# Patient Record
Sex: Female | Born: 1990 | Race: Black or African American | Hispanic: No | Marital: Single | Smoking: Former smoker
Health system: Southern US, Community
[De-identification: ages and names within clinical notes are randomized; demographics above are authoritative.]

## PROBLEM LIST (undated history)

## (undated) ENCOUNTER — Inpatient Hospital Stay (HOSPITAL_COMMUNITY): Payer: Self-pay

## (undated) DIAGNOSIS — O139 Gestational [pregnancy-induced] hypertension without significant proteinuria, unspecified trimester: Secondary | ICD-10-CM

## (undated) HISTORY — PX: NO PAST SURGERIES: SHX2092

---
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
## 2010-01-16 ENCOUNTER — Inpatient Hospital Stay (HOSPITAL_COMMUNITY): Admission: AD | Admit: 2010-01-16 | Discharge: 2010-01-16 | Payer: Self-pay | Admitting: Obstetrics & Gynecology

## 2010-01-16 ENCOUNTER — Ambulatory Visit: Payer: Self-pay | Admitting: Advanced Practice Midwife

## 2010-05-28 ENCOUNTER — Inpatient Hospital Stay (HOSPITAL_COMMUNITY)
Admission: AD | Admit: 2010-05-28 | Discharge: 2010-05-28 | Payer: Self-pay | Source: Home / Self Care | Admitting: Family Medicine

## 2010-05-28 ENCOUNTER — Encounter: Payer: Self-pay | Admitting: Family Medicine

## 2010-05-28 ENCOUNTER — Ambulatory Visit: Payer: Self-pay | Admitting: Nurse Practitioner

## 2010-07-17 ENCOUNTER — Emergency Department (HOSPITAL_COMMUNITY): Admission: EM | Admit: 2010-07-17 | Discharge: 2010-07-17 | Payer: Self-pay | Admitting: Emergency Medicine

## 2010-08-22 ENCOUNTER — Inpatient Hospital Stay (HOSPITAL_COMMUNITY)
Admission: AD | Admit: 2010-08-22 | Discharge: 2010-08-22 | Payer: Self-pay | Source: Home / Self Care | Admitting: Obstetrics & Gynecology

## 2010-09-03 ENCOUNTER — Ambulatory Visit: Payer: Self-pay | Admitting: Obstetrics and Gynecology

## 2010-09-03 ENCOUNTER — Inpatient Hospital Stay (HOSPITAL_COMMUNITY): Admission: AD | Admit: 2010-09-03 | Discharge: 2010-09-07 | Payer: Self-pay | Admitting: Obstetrics & Gynecology

## 2010-10-10 ENCOUNTER — Ambulatory Visit: Payer: Self-pay | Admitting: Obstetrics & Gynecology

## 2010-12-24 LAB — COMPREHENSIVE METABOLIC PANEL
ALT: 14 U/L (ref 0–35)
BUN: 3 mg/dL — ABNORMAL LOW (ref 6–23)
CO2: 22 mEq/L (ref 19–32)
Chloride: 109 mEq/L (ref 96–112)
Creatinine, Ser: 0.67 mg/dL (ref 0.4–1.2)
GFR calc Af Amer: >60 mL/min (ref >60)
Glucose, Bld: 103 mg/dL — ABNORMAL HIGH (ref 70–99)
Potassium: 3.6 mEq/L (ref 3.5–5.1)
Sodium: 138 mEq/L (ref 135–145)

## 2010-12-24 LAB — CBC
HCT: 28.3 % — ABNORMAL LOW (ref 36.0–46.0)
HCT: 29.3 % — ABNORMAL LOW (ref 36.0–46.0)
Hemoglobin: 10.2 g/dL — ABNORMAL LOW (ref 12.0–15.0)
Hemoglobin: 9.8 g/dL — ABNORMAL LOW (ref 12.0–15.0)
MCH: 33.9 pg (ref 26.0–34.0)
MCHC: 34.1 g/dL (ref 30.0–36.0)
MCHC: 34.4 g/dL (ref 30.0–36.0)
MCV: 99.4 fL (ref 78.0–100.0)
Platelets: 173 10*3/uL (ref 150–400)
Platelets: 194 10*3/uL (ref 150–400)
Platelets: 195 10*3/uL (ref 150–400)
RBC: 2.86 MIL/uL — ABNORMAL LOW (ref 3.87–5.11)
RBC: 3 MIL/uL — ABNORMAL LOW (ref 3.87–5.11)
RDW: 14.5 % (ref 11.5–15.5)
RDW: 14.8 % (ref 11.5–15.5)
RDW: 15.1 % (ref 11.5–15.5)
WBC: 10.9 10*3/uL — ABNORMAL HIGH (ref 4.0–10.5)
WBC: 17.2 10*3/uL — ABNORMAL HIGH (ref 4.0–10.5)

## 2010-12-24 LAB — DIFFERENTIAL
Basophils Absolute: 0 10*3/uL (ref 0.0–0.1)
Basophils Absolute: 0 10*3/uL (ref 0.0–0.1)
Basophils Relative: 0 % (ref 0–1)
Eosinophils Absolute: 0 10*3/uL (ref 0.0–0.7)
Eosinophils Relative: 0 % (ref 0–5)
Eosinophils Relative: 1 % (ref 0–5)
Lymphocytes Relative: 26 % (ref 12–46)
Lymphs Abs: 1.4 10*3/uL (ref 0.7–4.0)
Monocytes Absolute: 1.1 10*3/uL — ABNORMAL HIGH (ref 0.1–1.0)
Monocytes Relative: 6 % (ref 3–12)
Neutro Abs: 3.4 10*3/uL (ref 1.7–7.7)
Neutrophils Relative %: 89 % — ABNORMAL HIGH (ref 43–77)

## 2010-12-24 LAB — RAPID URINE DRUG SCREEN, HOSP PERFORMED
Barbiturates: NOT DETECTED
Benzodiazepines: NOT DETECTED
Cocaine: NOT DETECTED
Tetrahydrocannabinol: NOT DETECTED

## 2010-12-24 LAB — MRSA PCR SCREENING: MRSA by PCR: NEGATIVE

## 2010-12-24 LAB — STREP B DNA PROBE

## 2010-12-24 LAB — RPR: RPR Ser Ql: NONREACTIVE

## 2010-12-24 LAB — URINE CULTURE
Colony Count: 6000
Culture  Setup Time: 201111252307
Special Requests: NEGATIVE

## 2010-12-24 LAB — URINALYSIS, ROUTINE W REFLEX MICROSCOPIC
Ketones, ur: NEGATIVE mg/dL
Nitrite: NEGATIVE
Protein, ur: NEGATIVE mg/dL
Specific Gravity, Urine: <1.005 — ABNORMAL LOW (ref 1.005–1.030)
Urobilinogen, UA: 1 mg/dL (ref 0.0–1.0)

## 2010-12-24 LAB — URINALYSIS, DIPSTICK ONLY
Glucose, UA: NEGATIVE mg/dL
pH: 7 (ref 5.0–8.0)

## 2010-12-24 LAB — LACTATE DEHYDROGENASE: LDH: 130 U/L (ref 94–250)

## 2010-12-24 LAB — SICKLE CELL SCREEN: Sickle Cell Screen: NEGATIVE

## 2010-12-24 LAB — URINE MICROSCOPIC-ADD ON

## 2010-12-24 LAB — PROTEIN / CREATININE RATIO, URINE: Creatinine, Urine: 30 mg/dL

## 2010-12-24 LAB — HEPATITIS B SURFACE ANTIGEN: Hepatitis B Surface Ag: NEGATIVE

## 2010-12-24 LAB — RUBELLA SCREEN: Rubella: 165.4 IU/mL — ABNORMAL HIGH

## 2010-12-27 LAB — URINALYSIS, ROUTINE W REFLEX MICROSCOPIC
Bilirubin Urine: NEGATIVE
Bilirubin Urine: NEGATIVE
Glucose, UA: NEGATIVE mg/dL
Ketones, ur: NEGATIVE mg/dL
Ketones, ur: NEGATIVE mg/dL
Nitrite: NEGATIVE
Protein, ur: NEGATIVE mg/dL
pH: 7.5 (ref 5.0–8.0)

## 2010-12-27 LAB — WET PREP, GENITAL: Yeast Wet Prep HPF POC: NONE SEEN

## 2010-12-27 LAB — URINE MICROSCOPIC-ADD ON

## 2010-12-27 LAB — GC/CHLAMYDIA PROBE AMP, URINE: GC Probe Amp, Urine: NEGATIVE

## 2011-01-01 LAB — URINALYSIS, ROUTINE W REFLEX MICROSCOPIC
Glucose, UA: NEGATIVE mg/dL
Nitrite: NEGATIVE
Protein, ur: NEGATIVE mg/dL
Urobilinogen, UA: 0.2 mg/dL (ref 0.0–1.0)

## 2011-01-01 LAB — WET PREP, GENITAL

## 2011-01-01 LAB — GC/CHLAMYDIA PROBE AMP, GENITAL
Chlamydia, DNA Probe: POSITIVE — AB
GC Probe Amp, Genital: NEGATIVE

## 2011-01-01 LAB — URINE MICROSCOPIC-ADD ON

## 2011-10-21 ENCOUNTER — Inpatient Hospital Stay (HOSPITAL_COMMUNITY)
Admission: AD | Admit: 2011-10-21 | Discharge: 2011-10-21 | Disposition: A | Discharge status: Home / Self Care | Payer: Medicaid Other | Source: Ambulatory Visit | Attending: Obstetrics & Gynecology | Admitting: Obstetrics & Gynecology

## 2011-10-21 ENCOUNTER — Encounter (HOSPITAL_COMMUNITY): Payer: Self-pay | Admitting: *Deleted

## 2011-10-21 DIAGNOSIS — Z331 Pregnant state, incidental: Secondary | ICD-10-CM

## 2011-10-21 DIAGNOSIS — M549 Dorsalgia, unspecified: Secondary | ICD-10-CM | POA: Insufficient documentation

## 2011-10-21 DIAGNOSIS — B3731 Acute candidiasis of vulva and vagina: Secondary | ICD-10-CM | POA: Insufficient documentation

## 2011-10-21 DIAGNOSIS — B373 Candidiasis of vulva and vagina: Secondary | ICD-10-CM

## 2011-10-21 DIAGNOSIS — O2390 Unspecified genitourinary tract infection in pregnancy, unspecified trimester: Secondary | ICD-10-CM | POA: Insufficient documentation

## 2011-10-21 HISTORY — DX: Gestational (pregnancy-induced) hypertension without significant proteinuria, unspecified trimester: O13.9

## 2011-10-21 LAB — URINALYSIS, ROUTINE W REFLEX MICROSCOPIC
Protein, ur: NEGATIVE mg/dL
Urobilinogen, UA: 0.2 mg/dL (ref 0.0–1.0)

## 2011-10-21 LAB — URINE CULTURE
Culture  Setup Time: 201301082133
Special Requests: NORMAL

## 2011-10-21 LAB — URINE MICROSCOPIC-ADD ON

## 2011-10-21 MED ORDER — FLUCONAZOLE 150 MG PO TABS: 150.0000 mg | ORAL_TABLET | Freq: Once | ORAL | Status: AC

## 2011-10-21 NOTE — Progress Notes (Signed)
Pt denies any urinary symptoms.

## 2011-10-21 NOTE — ED Provider Notes (Signed)
History   Chief Complaint:  Back Pain  Lynn Lynch is  21 y.o. G2P1001 Patient's last menstrual period was 09/29/2011.Marland Kitchen  Her pregnancy status is positive.  She is [redacted]w[redacted]d by LMP.  She presents complaining of dull spinal back pain, no urinary or other systemic symptoms.  No bleeding or abdominal pain.  Past Medical History  Diagnosis Date  . Gestational hypertension     Past Surgical History  Procedure Date  . No past surgeries     History reviewed. No pertinent family history.  History  Substance Use Topics  . Smoking status: Never Smoker   . Smokeless tobacco: Not on file  . Alcohol Use: No    Allergies: Allergies no known allergies  No prescriptions prior to admission    Physical Exam   Blood pressure 136/85, pulse 93, temperature 98.7 F (37.1 C), temperature source Oral, resp. rate 20, height 5\' 2"  (1.575 m), weight 92.352 kg (203 lb 9.6 oz), last menstrual period 09/29/2011, SpO2 99.00%. General: NAD Back: No CVAT  Labs: Recent Results (from the past 24 hour(s))  URINALYSIS, ROUTINE W REFLEX MICROSCOPIC   Collection Time   10/21/11  1:25 PM      Component Value Range   Color, Urine YELLOW  YELLOW    APPearance HAZY (*) CLEAR    Specific Gravity, Urine 1.025  1.005 - 1.030    pH 6.0  5.0 - 8.0    Glucose, UA NEGATIVE  NEGATIVE (mg/dL)   Hgb urine dipstick NEGATIVE  NEGATIVE    Bilirubin Urine NEGATIVE  NEGATIVE    Ketones, ur NEGATIVE  NEGATIVE (mg/dL)   Protein, ur NEGATIVE  NEGATIVE (mg/dL)   Urobilinogen, UA 0.2  0.0 - 1.0 (mg/dL)   Nitrite NEGATIVE  NEGATIVE    Leukocytes, UA MODERATE (*) NEGATIVE   URINE MICROSCOPIC-ADD ON   Collection Time   10/21/11  1:25 PM      Component Value Range   Squamous Epithelial / LPF FEW (*) RARE    WBC, UA 3-6  <3 (WBC/hpf)   RBC / HPF 3-6  <3 (RBC/hpf)   Bacteria, UA MANY (*) RARE    Urine-Other RARE YEAST    POCT PREGNANCY, URINE   Collection Time   10/21/11  2:32 PM      Component Value Range   Preg Test, Ur  POSITIVE     Assessment: Yeast infection Early pregnancy, no bleeding or abdominal pain.  Plan: Diflucan Rx given to patient She was advised to start prenatal care  Discharge Medications: Current Discharge Medication List    START taking these medications   Details  fluconazole (DIFLUCAN) 150 MG tablet Take 1 tablet (150 mg total) by mouth once. Qty: 1 tablet, Refills: 2         Wanya Bangura A 10/21/2011, 3:35 PM

## 2011-10-21 NOTE — Progress Notes (Signed)
Pt in c/o dull, intermittent lower back pain x2-3 days.  LMP 09/29/11.  Denies any bleeding or discharge.

## 2011-10-21 NOTE — Progress Notes (Signed)
Patient states she has had mid to low back pain for two days. Last period was only 3 days. No bleeding or discharge.

## 2011-11-04 ENCOUNTER — Inpatient Hospital Stay (HOSPITAL_COMMUNITY): Payer: Medicaid Other

## 2011-11-04 ENCOUNTER — Encounter (HOSPITAL_COMMUNITY): Payer: Self-pay | Admitting: *Deleted

## 2011-11-04 ENCOUNTER — Inpatient Hospital Stay (HOSPITAL_COMMUNITY)
Admission: AD | Admit: 2011-11-04 | Discharge: 2011-11-04 | Disposition: A | Discharge status: Home / Self Care | Payer: Medicaid Other | Source: Ambulatory Visit | Attending: Obstetrics & Gynecology | Admitting: Obstetrics & Gynecology

## 2011-11-04 DIAGNOSIS — O200 Threatened abortion: Secondary | ICD-10-CM | POA: Insufficient documentation

## 2011-11-04 LAB — CBC
HCT: 33.3 % — ABNORMAL LOW (ref 36.0–46.0)
Hemoglobin: 11.1 g/dL — ABNORMAL LOW (ref 12.0–15.0)
MCV: 91.5 fL (ref 78.0–100.0)
RBC: 3.64 MIL/uL — ABNORMAL LOW (ref 3.87–5.11)
WBC: 4.4 10*3/uL (ref 4.0–10.5)

## 2011-11-04 LAB — WET PREP, GENITAL
Clue Cells Wet Prep HPF POC: NONE SEEN
Trich, Wet Prep: NONE SEEN
Yeast Wet Prep HPF POC: NONE SEEN

## 2011-11-04 NOTE — Progress Notes (Signed)
Pt states," On Sunday I had some pink bleeding when I wiped, and it has gradually gotten heavier.I am changing my pad every two hours and I'm having cramping in my low abdomen and low back."

## 2011-11-04 NOTE — ED Provider Notes (Signed)
History    G2P1 at  [redacted] weeks gestation presents to MAU with vaginal bleeding starting with spotting 2 days ago and progressing to bright red bleeding, soaking 1 pad in 2 hours today.  She is also feeling back pain and abdominal cramping starting today.  Chief Complaint  Patient presents with  . Vaginal Bleeding  . Abdominal Pain  . Back Pain   HPI  OB History    Grav Para Term Preterm Abortions TAB SAB Ect Mult Living   2 1 1  0 0 0 0 0 0 1      Past Medical History  Diagnosis Date  . Gestational hypertension     Past Surgical History  Procedure Date  . No past surgeries     History reviewed. No pertinent family history.  History  Substance Use Topics  . Smoking status: Never Smoker   . Smokeless tobacco: Never Used  . Alcohol Use: No    Allergies: No Known Allergies  Prescriptions prior to admission  Medication Sig Dispense Refill  . acetaminophen (TYLENOL) 325 MG tablet Take 325 mg by mouth every 6 (six) hours as needed. Takes for  pain      . Prenatal Vit-Fe Fumarate-FA (PRENATAL MULTIVITAMIN) TABS Take 1 tablet by mouth daily.        Review of Systems  Constitutional: Negative.   HENT: Negative.   Eyes: Negative.   Respiratory: Negative.   Cardiovascular: Negative.   Gastrointestinal: Negative.   Genitourinary: Negative.   Musculoskeletal: Negative.   Skin: Negative.   Neurological: Negative.   Endo/Heme/Allergies: Negative.   Psychiatric/Behavioral: Negative.    Physical Exam   Blood pressure 125/82, pulse 66, temperature 98 F (36.7 C), temperature source Oral, resp. rate 16, height 5' 2.5" (1.588 m), weight 93.214 kg (205 lb 8 oz), last menstrual period 09/29/2011.  Physical Exam  Constitutional: She is oriented to person, place, and time. She appears well-developed.  Neck: Normal range of motion.  Respiratory: Effort normal.  GI: Soft.  Genitourinary: Cervix exhibits no motion tenderness and no friability.       Cervix pink, bright red  bleeding noted from cervical os, which appears closed.      Neurological: She is alert and oriented to person, place, and time.  Skin: Skin is warm and dry.  Psychiatric: She has a normal mood and affect. Her behavior is normal. Judgment and thought content normal.   Recent Results (from the past 336 hour(s))  WET PREP, GENITAL   Collection Time   11/04/11  6:27 PM      Component Value Range   Yeast, Wet Prep NONE SEEN  NONE SEEN    Trich, Wet Prep NONE SEEN  NONE SEEN    Clue Cells, Wet Prep NONE SEEN  NONE SEEN    WBC, Wet Prep HPF POC FEW (*) NONE SEEN   GC/CHLAMYDIA PROBE AMP, GENITAL   Collection Time   11/04/11  6:27 PM      Component Value Range   GC Probe Amp, Genital NEGATIVE  NEGATIVE    Chlamydia, DNA Probe NEGATIVE  NEGATIVE   CBC   Collection Time   11/04/11  6:40 PM      Component Value Range   WBC 4.4  4.0 - 10.5 (K/uL)   RBC 3.64 (*) 3.87 - 5.11 (MIL/uL)   Hemoglobin 11.1 (*) 12.0 - 15.0 (g/dL)   HCT 46.9 (*) 62.9 - 46.0 (%)   MCV 91.5  78.0 - 100.0 (fL)   MCH  30.5  26.0 - 34.0 (pg)   MCHC 33.3  30.0 - 36.0 (g/dL)   RDW 16.1  09.6 - 04.5 (%)   Platelets 235  150 - 400 (K/uL)  ABO/RH   Collection Time   11/04/11  6:40 PM      Component Value Range   ABO/RH(D) A POS    HCG, QUANTITATIVE, PREGNANCY   Collection Time   11/04/11  6:40 PM      Component Value Range   hCG, Beta Chain, Quant, S 14 (*) <5 (mIU/mL)   U/S indicates IUP but no yolk sac or fetal pole visualized with recommendation for sequential [facility] labs and f/u u/s if rising appropriately.  MAU Course  Procedures U/A Pelvic exam with wet prep and GC/Chlamydia U/S   Assessment and Plan  A: Threatened miscarriage  P: D/C home with bleeding precautions Return to MAU for Quantitative hCG in 48 hours  LEFTWICH-KIRBY, Shatara Stanek 11/04/2011, 8:40 PM

## 2011-11-05 LAB — GC/CHLAMYDIA PROBE AMP, GENITAL: GC Probe Amp, Genital: NEGATIVE

## 2011-12-15 ENCOUNTER — Emergency Department (HOSPITAL_COMMUNITY)
Admission: EM | Admit: 2011-12-15 | Discharge: 2011-12-15 | Disposition: A | Discharge status: Home Health | Payer: Medicaid Other | Attending: Emergency Medicine | Admitting: Emergency Medicine

## 2011-12-15 ENCOUNTER — Encounter (HOSPITAL_COMMUNITY): Payer: Self-pay | Admitting: Emergency Medicine

## 2011-12-15 DIAGNOSIS — F17200 Nicotine dependence, unspecified, uncomplicated: Secondary | ICD-10-CM | POA: Insufficient documentation

## 2011-12-15 DIAGNOSIS — K047 Periapical abscess without sinus: Secondary | ICD-10-CM

## 2011-12-15 DIAGNOSIS — K089 Disorder of teeth and supporting structures, unspecified: Secondary | ICD-10-CM | POA: Insufficient documentation

## 2011-12-15 DIAGNOSIS — R220 Localized swelling, mass and lump, head: Secondary | ICD-10-CM | POA: Insufficient documentation

## 2011-12-15 MED ORDER — PENICILLIN V POTASSIUM 250 MG PO TABS
500.0000 mg | ORAL_TABLET | Freq: Once | ORAL | Status: AC
  Administered 2011-12-15: 500 mg via ORAL
  Filled 2011-12-15: qty 2

## 2011-12-15 MED ORDER — PENICILLIN V POTASSIUM 500 MG PO TABS: 500.0000 mg | ORAL_TABLET | Freq: Three times a day (TID) | ORAL | Status: AC

## 2011-12-15 MED ORDER — HYDROCODONE-ACETAMINOPHEN 5-325 MG PO TABS: 1.0000 | ORAL_TABLET | Freq: Once | ORAL | Status: AC

## 2011-12-15 MED ORDER — HYDROCODONE-ACETAMINOPHEN 5-325 MG PO TABS
1.0000 | ORAL_TABLET | Freq: Once | ORAL | Status: AC
  Administered 2011-12-15: 1 via ORAL
  Filled 2011-12-15: qty 1

## 2011-12-15 NOTE — ED Notes (Signed)
Onset middle of the night awoke with swelling left side of face radiating to left neck. Airway intact states pain 10/10 achy.  Bilateral equal chest rise and fall. Resting comfortably on stretcher. ax4

## 2011-12-15 NOTE — Discharge Instructions (Signed)
FOLLOW UP WITH YOUR DENTIST FOR FURTHER MANAGEMENT OF DENTAL ABSCESS. TAKE MEDICATIONS AS PRESCRIBED AND RETURN HERE AS NEEDED.  Dental Abscess A dental abscess usually starts from an infected tooth. Antibiotic medicine and pain pills can be helpful, but dental infections require the attention of a dentist. Rinse around the infected area often with salt water (a pinch of salt in 8 oz of warm water). Do not apply heat to the outside of your face. See your dentist or oral surgeon as soon as possible.  SEEK IMMEDIATE MEDICAL CARE IF:  You have increasing, severe pain that is not relieved by medicine.   You or your child has an oral temperature above 102 F (38.9 C), not controlled by medicine.   Your baby is older than 3 months with a rectal temperature of 102 F (38.9 C) or higher.   Your baby is 58 months old or younger with a rectal temperature of 100.4 F (38 C) or higher.   You develop chills, severe headache, difficulty breathing, or trouble swallowing.   You have swelling in the neck or around the eye.  Document Released: 09/29/2005 Document Revised: 09/18/2011 Document Reviewed: 03/10/2007 [facility] Patient Information 2012 [place], [place].

## 2011-12-15 NOTE — ED Notes (Signed)
Onset middle of last night left face swelling radiating to left neck. Airway intact bilateral equal chest rise and fall. No distress. Pain 10/10 achy.  Ax4

## 2011-12-15 NOTE — ED Provider Notes (Signed)
History     CSN: [id number]  Arrival date & time 12/15/11  0906   First MD Initiated Contact with Patient 12/15/11 [phone]      Chief Complaint  Patient presents with  . Facial Swelling    (Consider location/radiation/quality/duration/timing/severity/associated sxs/prior treatment) Patient is a 21 y.o. female presenting with tooth pain. The history is provided by the patient.  Dental PainThe primary symptoms include mouth pain. Primary symptoms do not include fever. The symptoms began yesterday. The symptoms are worsening. The symptoms occur constantly.  Additional symptoms include: gum tenderness and facial swelling. Additional symptoms do not include: trismus and trouble swallowing. Associated symptoms comments: No fever. No dental injury..    Past Medical History  Diagnosis Date  . Gestational hypertension     Past Surgical History  Procedure Date  . No past surgeries     No family history on file.  History  Substance Use Topics  . Smoking status: Current Some Day Smoker  . Smokeless tobacco: Never Used  . Alcohol Use: No    OB History    Grav Para Term Preterm Abortions TAB SAB Ect Mult Living   2 1 1  0 0 0 0 0 0 1      Review of Systems  Constitutional: Negative for fever and chills.  HENT: Positive for facial swelling and dental problem. Negative for trouble swallowing.   Respiratory: Negative.   Cardiovascular: Negative.   Gastrointestinal: Negative.   Musculoskeletal: Negative.   Skin: Negative.   Neurological: Negative.     Allergies  Review of patient's allergies indicates no known allergies.  Home Medications   Current Outpatient Rx  Name Route Sig Dispense Refill  . ACETAMINOPHEN 325 MG PO TABS Oral Take 325 mg by mouth every 6 (six) hours as needed. Takes for  pain    . PRENATAL MULTIVITAMIN CH Oral Take 1 tablet by mouth daily.      BP 119/81  Pulse 84  Temp(Src) 98.3 F (36.8 C) (Oral)  Resp 16  SpO2 100%  LMP 09/29/2011  Physical  Exam  Constitutional: She is oriented to person, place, and time. She appears well-developed and well-nourished.  HENT:       Left facial swelling without palpable cutaneous abscess. Good dentition with tenderness along upper left alveolar ridge. No visualized dental abscess.   Neck: Normal range of motion.  Pulmonary/Chest: Effort normal.  Neurological: She is alert and oriented to person, place, and time.  Skin: Skin is warm and dry.    ED Course  Procedures (including critical care time)  Labs Reviewed - No data to display No results found.   No diagnosis found.    MDM          Rodena Medin, PA-C 12/15/11 1006

## 2011-12-15 NOTE — ED Provider Notes (Signed)
Medical screening examination/treatment/procedure(s) were performed by non-physician practitioner and as supervising physician I was immediately available for consultation/collaboration.  Doug Sou, MD 12/15/11 [phone]

## 2012-03-30 ENCOUNTER — Encounter (HOSPITAL_COMMUNITY): Payer: Self-pay | Admitting: *Deleted

## 2012-03-30 ENCOUNTER — Inpatient Hospital Stay (HOSPITAL_COMMUNITY)
Admission: AD | Admit: 2012-03-30 | Discharge: 2012-03-30 | Disposition: A | Discharge status: Home / Self Care | Payer: Self-pay | Source: Ambulatory Visit | Attending: Family Medicine | Admitting: Family Medicine

## 2012-03-30 DIAGNOSIS — N926 Irregular menstruation, unspecified: Secondary | ICD-10-CM | POA: Insufficient documentation

## 2012-03-30 DIAGNOSIS — M549 Dorsalgia, unspecified: Secondary | ICD-10-CM | POA: Insufficient documentation

## 2012-03-30 LAB — URINALYSIS, ROUTINE W REFLEX MICROSCOPIC
Bilirubin Urine: NEGATIVE
Glucose, UA: NEGATIVE mg/dL
Hgb urine dipstick: NEGATIVE
Protein, ur: NEGATIVE mg/dL
Urobilinogen, UA: 0.2 mg/dL (ref 0.0–1.0)

## 2012-03-30 NOTE — MAU Note (Signed)
Pt states, " I've had pain in my lower right back for nearly a week ( since last Thurs) . My last normal  period was in May and this month it lasted only two days."

## 2012-03-30 NOTE — MAU Provider Note (Signed)
History     CSN: [id number]  Arrival date and time: 03/30/12 1450   First Provider Initiated Contact with Patient 03/30/12 1530      Chief Complaint  Patient presents with  . Back Pain   HPI Lynn Lynch is 21 y.o. G2P1001 [redacted]w[redacted]d weeks presenting with back pain that began 5 days ago.  Describes as intermittent.  Nothing makes it worse or better.  Has not taken anything for the pain.  She is a patient of Lynn Lynch, last seen in January.   She reports having a miscarriage 11/06/11.  Had regular periods until June.  June's cycle should be now, but she had 2 days of bleeding last week.  Sexually active using condoms sometimes.  Denies abnormal discharge.  Was concerned she is pregnant.  Reports urinary frequency without dysuria.  She denies concerns for STIs.  "I am here for a pregnancy test and back pain, I don't need an exam".      Past Medical History  Diagnosis Date  . Gestational hypertension     Past Surgical History  Procedure Date  . No past surgeries     No family history on file.  History  Substance Use Topics  . Smoking status: Current Some Day Smoker  . Smokeless tobacco: Never Used  . Alcohol Use: No    Allergies: No Known Allergies  Prescriptions prior to admission  Medication Sig Dispense Refill  . acetaminophen (TYLENOL) 325 MG tablet Take 325 mg by mouth every 6 (six) hours as needed. Takes for  pain      . Prenatal Vit-Fe Fumarate-FA (PRENATAL MULTIVITAMIN) TABS Take 1 tablet by mouth daily.        Review of Systems  Constitutional: Negative for fever and chills.  Gastrointestinal: Negative for nausea, vomiting and abdominal pain.  Genitourinary: Positive for frequency. Negative for dysuria, urgency and hematuria.       Negative for vaginal discharge or bleeding  Musculoskeletal: Positive for back pain.   Physical Exam   Blood pressure 128/75, pulse 79, temperature 98.2 F (36.8 C), temperature source Oral, resp. rate 18, height 5\' 2"  (1.575  m), weight 92.987 kg (205 lb), last menstrual period 02/27/2012, unknown if currently breastfeeding.  Physical Exam  Constitutional: She is oriented to person, place, and time. She appears well-developed and well-nourished. No distress.  HENT:  Head: Normocephalic.  Neck: Normal range of motion.  Respiratory: Effort normal.  GI:       Declined by the patient  Genitourinary:       Declines by the patient  Neurological: She is alert and oriented to person, place, and time.  Skin: Skin is warm and dry.  Psychiatric: She has a normal mood and affect. Her behavior is normal.   Results for orders placed during the hospital encounter of 03/30/12 (from the past 24 hour(s))  URINALYSIS, ROUTINE W REFLEX MICROSCOPIC     Status: Abnormal   Collection Time   03/30/12  3:15 PM      Component Value Range   Color, Urine YELLOW  YELLOW   APPearance CLEAR  CLEAR   Specific Gravity, Urine >1.030 (*) 1.005 - 1.030   pH 5.5  5.0 - 8.0   Glucose, UA NEGATIVE  NEGATIVE mg/dL   Hgb urine dipstick NEGATIVE  NEGATIVE   Bilirubin Urine NEGATIVE  NEGATIVE   Ketones, ur NEGATIVE  NEGATIVE mg/dL   Protein, ur NEGATIVE  NEGATIVE mg/dL   Urobilinogen, UA 0.2  0.0 - 1.0 mg/dL   Nitrite  NEGATIVE  NEGATIVE   Leukocytes, UA NEGATIVE  NEGATIVE  POCT PREGNANCY, URINE     Status: Normal   Collection Time   03/30/12  3:21 PM      Component Value Range   Preg Test, Ur NEGATIVE  NEGATIVE   MAU Course  Procedures  MDM Reviewed labs with the patient.    Assessment and Plan  A:  Back Pain     Irregular menstrual cycle with negative pregnancy test  P: Follow up with Dr. Tamela Lynch is symptom persist     Increase po fluids  Lynn Lynch,EVE M 03/30/2012, 3:32 PM

## 2012-03-30 NOTE — Discharge Instructions (Signed)
Back Pain, Adult Back pain is very common. The pain often gets better over time. The cause of back pain is usually not dangerous. Most people can learn to manage their back pain on their own.  HOME CARE   Stay active. Start with short walks on flat ground if you can. Try to walk farther each day.   Do not sit, drive, or stand in one place for more than 30 minutes. Do not stay in bed.   Do not avoid exercise or work. Activity can help your back heal faster.   Be careful when you bend or lift an object. Bend at your knees, keep the object close to you, and do not twist.   Sleep on a firm mattress. Lie on your side, and bend your knees. If you lie on your back, put a pillow under your knees.   Only take medicines as told by your doctor.   Put ice on the injured area.   Put ice in a plastic bag.   Place a towel between your skin and the bag.   Leave the ice on for 15 to 20 minutes, 3 to 4 times a day for the first 2 to 3 days. After that, you can switch between ice and heat packs.   Ask your doctor about back exercises or massage.   Avoid feeling anxious or stressed. Find good ways to deal with stress, such as exercise.  GET HELP RIGHT AWAY IF:   Your pain does not go away with rest or medicine.   Your pain does not go away in 1 week.   You have new problems.   You do not feel well.   The pain spreads into your legs.   You cannot control when you poop (bowel movement) or pee (urinate).   Your arms or legs feel weak or lose feeling (numbness).   You feel sick to your stomach (nauseous) or throw up (vomit).   You have belly (abdominal) pain.   You feel like you may pass out (faint).  MAKE SURE YOU:   Understand these instructions.   Will watch your condition.   Will get help right away if you are not doing well or get worse.  Document Released: 03/17/2008 Document Revised: 09/18/2011 Document Reviewed: 02/17/2011 [facility] Patient Information 2012 [place],  [place].  IMPORTANT TO Drink 6-8 glasses of water a day

## 2012-10-13 NOTE — L&D Delivery Note (Signed)
Delivery Note At 6:48 AM a viable female was delivered via Vaginal, Spontaneous Delivery (Presentation: right Occiput Anterior).  APGAR: 8, 9; weight pending.   Placenta status: Intact, Spontaneous.  Cord: 3 vessels with the following complications: None.    Anesthesia: Epidural  Episiotomy: None Lacerations: None Suture Repair: none Est. Blood Loss (mL): 400  Mom to postpartum.  Baby to nursery-stable.  Pt labored down well and with several hard pushes delivered a viable F via SVD with spontaneous cry. Delivery was complicated by a small periurethral tear, which was hemostatic and did not require repair. Third stage of labor was managed via manual traction and pit. EBL was approx 400 2/2 body habitus of pt and difficulty reaching uterine fundus to apply manual pressure. Pt did well and was hemodynamically stable for transfer to postpartum with baby.   [name] 08/02/2013, 7:32 AM  I was present for and supervised the delivery of this newborn. I agree with above documentation.   Marrietta Thunder, Redmond Baseman, MD

## 2012-11-20 ENCOUNTER — Inpatient Hospital Stay (HOSPITAL_COMMUNITY)
Admission: AD | Admit: 2012-11-20 | Discharge: 2012-11-20 | Disposition: A | Discharge status: Home / Self Care | Payer: Self-pay | Source: Ambulatory Visit | Attending: Obstetrics & Gynecology | Admitting: Obstetrics & Gynecology

## 2012-11-20 ENCOUNTER — Encounter (HOSPITAL_COMMUNITY): Payer: Self-pay | Admitting: *Deleted

## 2012-11-20 DIAGNOSIS — Z3490 Encounter for supervision of normal pregnancy, unspecified, unspecified trimester: Secondary | ICD-10-CM

## 2012-11-20 DIAGNOSIS — O98819 Other maternal infectious and parasitic diseases complicating pregnancy, unspecified trimester: Secondary | ICD-10-CM | POA: Insufficient documentation

## 2012-11-20 DIAGNOSIS — M545 Low back pain, unspecified: Secondary | ICD-10-CM | POA: Insufficient documentation

## 2012-11-20 DIAGNOSIS — N949 Unspecified condition associated with female genital organs and menstrual cycle: Secondary | ICD-10-CM | POA: Insufficient documentation

## 2012-11-20 DIAGNOSIS — A5901 Trichomonal vulvovaginitis: Secondary | ICD-10-CM | POA: Insufficient documentation

## 2012-11-20 DIAGNOSIS — S39012A Strain of muscle, fascia and tendon of lower back, initial encounter: Secondary | ICD-10-CM

## 2012-11-20 LAB — URINALYSIS, ROUTINE W REFLEX MICROSCOPIC
Bilirubin Urine: NEGATIVE
Glucose, UA: NEGATIVE mg/dL
Ketones, ur: NEGATIVE mg/dL
Specific Gravity, Urine: 1.025 (ref 1.005–1.030)
pH: 6 (ref 5.0–8.0)

## 2012-11-20 LAB — WET PREP, GENITAL: Yeast Wet Prep HPF POC: NONE SEEN

## 2012-11-20 LAB — URINE MICROSCOPIC-ADD ON

## 2012-11-20 MED ORDER — PRENATAL PLUS 27-1 MG PO TABS: 1.0000 | ORAL_TABLET | Freq: Every day | ORAL | Status: DC

## 2012-11-20 MED ORDER — METRONIDAZOLE 500 MG PO TABS: 500.0000 mg | ORAL_TABLET | Freq: Three times a day (TID) | ORAL | Status: DC

## 2012-11-20 NOTE — MAU Provider Note (Signed)
Attestation of Attending Supervision of Advanced Practitioner (CNM/NP): Evaluation and management procedures were performed by the Advanced Practitioner under my supervision and collaboration.  I have reviewed the Advanced Practitioner's note and chart, and I agree with the management and plan.  HARRAWAY-SMITH, Mithcell Schumpert 4:32 PM     

## 2012-11-20 NOTE — MAU Note (Signed)
Pt presents with complaints of back pain and abdominal pain and states that is has been going on approximately 2 weeks.

## 2012-11-20 NOTE — MAU Provider Note (Signed)
CC: Back Pain and Possible Pregnancy     HPI Lynn Lynch is a 22 y.o. G3P1011 at [redacted]w[redacted]d by LMP who presents with onset of mid low back pain about 3 weeks ago; worse with moving. She attributes it to picking up her 35-year-old. She denies upper back pain, dysuria, hematuria, frequency or urgency of urination. Concerned that her vaginal discharge is yellowish on underwear and at times pruritic.  Had positive HPT. No abdominal pain or vaginal bleeding.   Past Medical History  Diagnosis Date  . Gestational hypertension   Denies known HTN when not pregnant  OB History   Grav Para Term Preterm Abortions TAB SAB Ect Mult Living   3 1 1  0 1 0 1 0 0 1     # Outc Date GA Lbr Len/2nd Wgt Sex Del Anes PTL Lv   1 TRM 11/12    M SVD EPI  Yes   2 SAB            3 CUR               Past Surgical History  Procedure Laterality Date  . No past surgeries      History   Social History  . Marital Status: Single    Spouse Name: N/A    Number of Children: N/A  . Years of Education: N/A   Occupational History  . Not on file.   Social History Main Topics  . Smoking status: Former Smoker    Quit date: 10/21/2011  . Smokeless tobacco: Never Used  . Alcohol Use: No  . Drug Use: No  . Sexually Active: Yes    Birth Control/ Protection: None   Other Topics Concern  . Not on file   Social History Narrative  . No narrative on file    No current facility-administered medications on file prior to encounter.   No current outpatient prescriptions on file prior to encounter.    No Known Allergies  ROS Pertinent items in HPI  PHYSICAL EXAM Filed Vitals:   11/20/12 1137  BP: 141/75  Pulse: 90  Temp: 97.7 F (36.5 C)  Resp: 16   General: Well nourished, well developed female in no acute distress Cardiovascular: Normal rate Respiratory: Normal effort Abdomen: Soft, nontender Back: No CVAT Extremities: No edema Neurologic: Alert and oriented Speculum exam: NEFG; vagina with  white discharge, no blood; cervix clean Bimanual exam: cervix closed, no CMT; uterus NSSP; no adnexal tenderness or masses  LAB RESULTS Results for orders placed during the hospital encounter of 11/20/12 (from the past 24 hour(s))  URINALYSIS, ROUTINE W REFLEX MICROSCOPIC     Status: Abnormal   Collection Time    11/20/12 11:32 AM      Result Value Range   Color, Urine YELLOW  YELLOW   APPearance CLEAR  CLEAR   Specific Gravity, Urine 1.025  1.005 - 1.030   pH 6.0  5.0 - 8.0   Glucose, UA NEGATIVE  NEGATIVE mg/dL   Hgb urine dipstick TRACE (*) NEGATIVE   Bilirubin Urine NEGATIVE  NEGATIVE   Ketones, ur NEGATIVE  NEGATIVE mg/dL   Protein, ur NEGATIVE  NEGATIVE mg/dL   Urobilinogen, UA 0.2  0.0 - 1.0 mg/dL   Nitrite NEGATIVE  NEGATIVE   Leukocytes, UA MODERATE (*) NEGATIVE  URINE MICROSCOPIC-ADD ON     Status: Abnormal   Collection Time    11/20/12 11:32 AM      Result Value Range   Squamous Epithelial / LPF  FEW (*) RARE   WBC, UA 11-20  <3 WBC/hpf   RBC / HPF 0-2  <3 RBC/hpf   Bacteria, UA RARE  RARE   Urine-Other MUCOUS PRESENT    POCT PREGNANCY, URINE     Status: Abnormal   Collection Time    11/20/12 11:43 AM      Result Value Range   Preg Test, Ur POSITIVE (*) NEGATIVE  WET PREP, GENITAL     Status: Abnormal   Collection Time    11/20/12 12:05 PM      Result Value Range   Yeast Wet Prep HPF POC NONE SEEN  NONE SEEN   Trich, Wet Prep FEW (*) NONE SEEN   Clue Cells Wet Prep HPF POC FEW (*) NONE SEEN   WBC, Wet Prep HPF POC MODERATE (*) NONE SEEN    IMAGING No results found.  MAU COURSE  ASSESSMENT  1. Low back strain, initial encounter   2. Pregnancy   3. Trichomonal vaginitis     PLAN Discharge home. See AVS for patient education.    Medication List    TAKE these medications       metroNIDAZOLE 500 MG tablet  Commonly known as:  FLAGYL  Take 1 tablet (500 mg total) by mouth 3 (three) times daily.     prenatal vitamin w/FE, FA 27-1 MG Tabs  Take  1 tablet by mouth daily.       Follow-up Information   Schedule an appointment as soon as possible for a visit with [facility].   Contact information:   [address] [place] [address] [phone]       Danae Orleans, CNM 11/20/2012 11:58 AM

## 2012-11-23 LAB — GC/CHLAMYDIA PROBE AMP: CT Probe RNA: POSITIVE — AB

## 2013-01-13 ENCOUNTER — Inpatient Hospital Stay (HOSPITAL_COMMUNITY)
Admission: AD | Admit: 2013-01-13 | Discharge: 2013-01-13 | Disposition: A | Discharge status: Home / Self Care | Payer: Medicaid Other | Source: Ambulatory Visit | Attending: Obstetrics & Gynecology | Admitting: Obstetrics & Gynecology

## 2013-01-13 ENCOUNTER — Encounter (HOSPITAL_COMMUNITY): Payer: Self-pay | Admitting: *Deleted

## 2013-01-13 DIAGNOSIS — O98819 Other maternal infectious and parasitic diseases complicating pregnancy, unspecified trimester: Secondary | ICD-10-CM | POA: Insufficient documentation

## 2013-01-13 DIAGNOSIS — M545 Low back pain, unspecified: Secondary | ICD-10-CM | POA: Insufficient documentation

## 2013-01-13 DIAGNOSIS — O210 Mild hyperemesis gravidarum: Secondary | ICD-10-CM | POA: Insufficient documentation

## 2013-01-13 DIAGNOSIS — M549 Dorsalgia, unspecified: Secondary | ICD-10-CM

## 2013-01-13 DIAGNOSIS — A5901 Trichomonal vulvovaginitis: Secondary | ICD-10-CM | POA: Insufficient documentation

## 2013-01-13 DIAGNOSIS — A599 Trichomoniasis, unspecified: Secondary | ICD-10-CM

## 2013-01-13 DIAGNOSIS — R109 Unspecified abdominal pain: Secondary | ICD-10-CM

## 2013-01-13 DIAGNOSIS — O219 Vomiting of pregnancy, unspecified: Secondary | ICD-10-CM

## 2013-01-13 HISTORY — DX: Gestational (pregnancy-induced) hypertension without significant proteinuria, unspecified trimester: O13.9

## 2013-01-13 LAB — URINALYSIS, ROUTINE W REFLEX MICROSCOPIC
Hgb urine dipstick: NEGATIVE
Nitrite: NEGATIVE
Protein, ur: NEGATIVE mg/dL
Specific Gravity, Urine: 1.02 (ref 1.005–1.030)
Urobilinogen, UA: 1 mg/dL (ref 0.0–1.0)

## 2013-01-13 LAB — URINE MICROSCOPIC-ADD ON

## 2013-01-13 MED ORDER — PROMETHAZINE HCL 25 MG RE SUPP: 25.0000 mg | Freq: Four times a day (QID) | RECTAL | Status: DC | PRN

## 2013-01-13 NOTE — MAU Provider Note (Signed)
Attestation of Attending Supervision of Advanced Practitioner (CNM/NP): Evaluation and management procedures were performed by the Advanced Practitioner under my supervision and collaboration.  I have reviewed the Advanced Practitioner's note and chart, and I agree with the management and plan.  HARRAWAY-SMITH, Fermina Mishkin 7:32 PM

## 2013-01-13 NOTE — MAU Note (Signed)
Patient states she has had a positive pregnancy test at [facility] in February. Has had mid to low back pain for 2 days. States she has vomiting almost daily.

## 2013-01-13 NOTE — MAU Provider Note (Signed)
History     CSN: [id number]  Arrival date and time: 01/13/13 1342   None     Chief Complaint  Patient presents with  . Back Pain   HPI Lynn Lynch is 22 y.o. G3P1011 [redacted]w[redacted]d weeks presenting with back pain.and abdominal pain X 2 days.  Denies fever, vaginal bleeding, diarrhea.  Has a vaginal discharge with odor.  Had trichomonas the last visit and did not get medication b/c no insurance.  Vomiting 2-3 X days, doesn't have Rx for medication.  1 sexual partner.   Has frequency and burning with urination.  Had + UPT here in February.  She [facility]  Past Medical History  Diagnosis Date  . Gestational hypertension   . Pregnancy induced hypertension     Past Surgical History  Procedure Laterality Date  . No past surgeries      Family History  Problem Relation Age of Onset  . Hypertension Mother     History  Substance Use Topics  . Smoking status: Former Smoker    Quit date: 10/21/2011  . Smokeless tobacco: Never Used  . Alcohol Use: No    Allergies: No Known Allergies  Prescriptions prior to admission  Medication Sig Dispense Refill  . prenatal vitamin w/FE, FA (PRENATAL 1 + 1) 27-1 MG TABS Take 1 tablet by mouth daily.  30 each  0    Review of Systems  Constitutional: Positive for chills (a little). Negative for fever.  Gastrointestinal: Positive for nausea, vomiting and abdominal pain (lower right pain intermittent).  Genitourinary: Positive for dysuria and frequency.       Neg for vaginal bleeding.  + for vaginal discharge with odor-known trichomonas untreated   Physical Exam   Blood pressure 118/71, pulse 92, temperature 98.5 F (36.9 C), temperature source Oral, resp. rate 20, height 5\' 2"  (1.575 m), weight 206 lb 12.8 oz (93.804 kg), last menstrual period 10/30/2012, SpO2 100.00%.  Physical Exam  Constitutional: She is oriented to person, place, and time. She appears well-developed and well-nourished.  No distress.  HENT:  Head: Normocephalic.  Neck: Normal range of motion.  Cardiovascular: Normal rate.   Respiratory: Effort normal.  GI: Soft. She exhibits no distension and no mass. There is no tenderness. There is no rebound and no guarding.  Genitourinary: There is no rash, tenderness or lesion on the right labia. There is no rash, tenderness or lesion on the left labia. Uterus is enlarged (10 week size). Uterus is not tender. Cervix exhibits no motion tenderness, no discharge and no friability. Right adnexum displays no mass, no tenderness and no fullness. Left adnexum displays no mass, no tenderness and no fullness. No erythema, tenderness or bleeding around the vagina. Vaginal discharge (small amount of frothly discharge with odor) found.  Neurological: She is alert and oriented to person, place, and time.  Skin: Skin is warm and dry.  Psychiatric: She has a normal mood and affect. Her behavior is normal.   Results for orders placed during the hospital encounter of 01/13/13 (from the past 24 hour(s))  URINALYSIS, ROUTINE W REFLEX MICROSCOPIC     Status: Abnormal   Collection Time    01/13/13  2:05 PM      Result Value Range   Color, Urine YELLOW  YELLOW   APPearance CLEAR  CLEAR   Specific Gravity, Urine 1.020  1.005 - 1.030   pH 7.0  5.0 - 8.0   Glucose, UA NEGATIVE  NEGATIVE mg/dL   Hgb urine dipstick NEGATIVE  NEGATIVE   Bilirubin Urine NEGATIVE  NEGATIVE   Ketones, ur NEGATIVE  NEGATIVE mg/dL   Protein, ur NEGATIVE  NEGATIVE mg/dL   Urobilinogen, UA 1.0  0.0 - 1.0 mg/dL   Nitrite NEGATIVE  NEGATIVE   Leukocytes, UA TRACE (*) NEGATIVE  URINE MICROSCOPIC-ADD ON     Status: None   Collection Time    01/13/13  2:05 PM      Result Value Range   Squamous Epithelial / LPF RARE  RARE   WBC, UA 3-6  <3 WBC/hpf   Bacteria, UA RARE  RARE   Urine-Other TRICHOMONAS PRESENT     MAU Course  Procedures  MDM  Assessment and Plan  A:  Back and abdominal pain in early pregnancy       Known trichomonal infection-untreated     Nausea and vomiting in early pregnancy  P:  Stressed importance of her treating this infection and that her partner needs to be treated at the same time.  [facility] get [facility]s       Rx for Phenergan Supp to pharmacy      [facility] care   Lynn Lynch,EVE M 01/13/2013, 4:37 PM

## 2013-01-19 ENCOUNTER — Other Ambulatory Visit: Payer: Medicaid Other

## 2013-01-26 ENCOUNTER — Other Ambulatory Visit: Payer: Self-pay

## 2013-01-26 ENCOUNTER — Encounter: Payer: Self-pay | Admitting: Obstetrics & Gynecology

## 2013-02-03 ENCOUNTER — Other Ambulatory Visit: Payer: Self-pay

## 2013-02-03 DIAGNOSIS — Z3201 Encounter for pregnancy test, result positive: Secondary | ICD-10-CM

## 2013-02-03 LAB — HIV ANTIBODY (ROUTINE TESTING W REFLEX): HIV: NONREACTIVE

## 2013-02-04 LAB — OBSTETRIC PANEL
Antibody Screen: NEGATIVE
Basophils Absolute: 0 10*3/uL (ref 0.0–0.1)
Basophils Relative: 0 % (ref 0–1)
Eosinophils Absolute: 0.1 10*3/uL (ref 0.0–0.7)
Eosinophils Relative: 2 % (ref 0–5)
HCT: 32.7 % — ABNORMAL LOW (ref 36.0–46.0)
Lymphocytes Relative: 34 % (ref 12–46)
MCH: 31.2 pg (ref 26.0–34.0)
MCHC: 34.6 g/dL (ref 30.0–36.0)
MCV: 90.3 fL (ref 78.0–100.0)
Monocytes Absolute: 0.3 10*3/uL (ref 0.1–1.0)
Platelets: 229 10*3/uL (ref 150–400)
RDW: 15 % (ref 11.5–15.5)
Rubella: 12.7 Index — ABNORMAL HIGH (ref <0.90)
WBC: 4.5 10*3/uL (ref 4.0–10.5)

## 2013-02-07 LAB — HEMOGLOBINOPATHY EVALUATION
Hemoglobin Other: 0 %
Hgb A2 Quant: 1.4 % — ABNORMAL LOW (ref 2.2–3.2)
Hgb F Quant: 0.2 % (ref 0.0–2.0)
Hgb S Quant: 0 %

## 2013-03-23 ENCOUNTER — Encounter (HOSPITAL_COMMUNITY): Payer: Self-pay | Admitting: *Deleted

## 2013-03-23 ENCOUNTER — Emergency Department (HOSPITAL_COMMUNITY)
Admission: EM | Admit: 2013-03-23 | Discharge: 2013-03-23 | Disposition: A | Discharge status: Home / Self Care | Payer: Medicaid Other | Attending: Emergency Medicine | Admitting: Emergency Medicine

## 2013-03-23 ENCOUNTER — Emergency Department (HOSPITAL_COMMUNITY): Payer: Medicaid Other

## 2013-03-23 DIAGNOSIS — Z8679 Personal history of other diseases of the circulatory system: Secondary | ICD-10-CM | POA: Insufficient documentation

## 2013-03-23 DIAGNOSIS — S0990XA Unspecified injury of head, initial encounter: Secondary | ICD-10-CM | POA: Insufficient documentation

## 2013-03-23 DIAGNOSIS — Z3202 Encounter for pregnancy test, result negative: Secondary | ICD-10-CM | POA: Insufficient documentation

## 2013-03-23 DIAGNOSIS — S0003XA Contusion of scalp, initial encounter: Secondary | ICD-10-CM | POA: Insufficient documentation

## 2013-03-23 DIAGNOSIS — Z87891 Personal history of nicotine dependence: Secondary | ICD-10-CM | POA: Insufficient documentation

## 2013-03-23 DIAGNOSIS — S0083XA Contusion of other part of head, initial encounter: Secondary | ICD-10-CM

## 2013-03-23 LAB — POCT PREGNANCY, URINE: Preg Test, Ur: NEGATIVE

## 2013-03-23 NOTE — ED Provider Notes (Signed)
  Medical screening examination/treatment/procedure(s) were performed by non-physician practitioner and as supervising physician I was immediately available for consultation/collaboration.    Gerhard Munch, MD 03/23/13 [phone]
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
## 2013-03-23 NOTE — ED Notes (Signed)
Pt returned from radiology.
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
## 2013-03-28 ENCOUNTER — Encounter: Payer: Self-pay | Admitting: Obstetrics and Gynecology

## 2013-04-07 ENCOUNTER — Ambulatory Visit (INDEPENDENT_AMBULATORY_CARE_PROVIDER_SITE_OTHER): Payer: Medicaid Other | Admitting: Obstetrics & Gynecology

## 2013-04-07 ENCOUNTER — Encounter: Payer: Self-pay | Admitting: Obstetrics & Gynecology

## 2013-04-07 VITALS — BP 136/80 | Temp 98.5°F | Wt 208.5 lb

## 2013-04-07 DIAGNOSIS — N898 Other specified noninflammatory disorders of vagina: Secondary | ICD-10-CM

## 2013-04-07 DIAGNOSIS — A749 Chlamydial infection, unspecified: Secondary | ICD-10-CM

## 2013-04-07 DIAGNOSIS — B373 Candidiasis of vulva and vagina: Secondary | ICD-10-CM

## 2013-04-07 DIAGNOSIS — O98819 Other maternal infectious and parasitic diseases complicating pregnancy, unspecified trimester: Secondary | ICD-10-CM | POA: Insufficient documentation

## 2013-04-07 DIAGNOSIS — O98312 Other infections with a predominantly sexual mode of transmission complicating pregnancy, second trimester: Secondary | ICD-10-CM

## 2013-04-07 DIAGNOSIS — O0932 Supervision of pregnancy with insufficient antenatal care, second trimester: Secondary | ICD-10-CM

## 2013-04-07 DIAGNOSIS — O98319 Other infections with a predominantly sexual mode of transmission complicating pregnancy, unspecified trimester: Secondary | ICD-10-CM

## 2013-04-07 DIAGNOSIS — O0930 Supervision of pregnancy with insufficient antenatal care, unspecified trimester: Secondary | ICD-10-CM

## 2013-04-07 DIAGNOSIS — O9989 Other specified diseases and conditions complicating pregnancy, childbirth and the puerperium: Secondary | ICD-10-CM

## 2013-04-07 LAB — POCT URINALYSIS DIP (DEVICE)
Glucose, UA: NEGATIVE mg/dL
Nitrite: NEGATIVE
Protein, ur: 30 mg/dL — AB
Urobilinogen, UA: 1 mg/dL (ref 0.0–1.0)

## 2013-04-07 NOTE — Patient Instructions (Addendum)
Pregnancy - Second Trimester The second trimester of pregnancy (3 to 6 months) is a period of rapid growth for you and your baby. At the end of the sixth month, your baby is about 9 inches long and weighs 1 1/2 pounds. You will begin to feel the baby move between 18 and 20 weeks of the pregnancy. This is called quickening. Weight gain is faster. A clear fluid (colostrum) may leak out of your breasts. You may feel small contractions of the womb (uterus). This is known as false labor or Braxton-Hicks contractions. This is like a practice for labor when the baby is ready to be born. Usually, the problems with morning sickness have usually passed by the end of your first trimester. Some women develop small dark blotches (called cholasma, mask of pregnancy) on their face that usually goes away after the baby is born. Exposure to the sun makes the blotches worse. Acne may also develop in some pregnant women and pregnant women who have acne, may find that it goes away. PRENATAL EXAMS  Blood work may continue to be done during prenatal exams. These tests are done to check on your health and the probable health of your baby. Blood work is used to follow your blood levels (hemoglobin). Anemia (low hemoglobin) is common during pregnancy. Iron and vitamins are given to help prevent this. You will also be checked for diabetes between 24 and 28 weeks of the pregnancy. Some of the previous blood tests may be repeated.  The size of the uterus is measured during each visit. This is to make sure that the baby is continuing to grow properly according to the dates of the pregnancy.  Your blood pressure is checked every prenatal visit. This is to make sure you are not getting toxemia.  Your urine is checked to make sure you do not have an infection, diabetes or protein in the urine.  Your weight is checked often to make sure gains are happening at the suggested rate. This is to ensure that both you and your baby are  growing normally.  Sometimes, an ultrasound is performed to confirm the proper growth and development of the baby. This is a test which bounces harmless sound waves off the baby so your caregiver can more accurately determine due dates. Sometimes, a test is done on the amniotic fluid surrounding the baby. This test is called an amniocentesis. The amniotic fluid is obtained by sticking a needle into the belly (abdomen). This is done to check the chromosomes in instances where there is a concern about possible genetic problems with the baby. It is also sometimes done near the end of pregnancy if an early delivery is required. In this case, it is done to help make sure the baby's lungs are mature enough for the baby to live outside of the womb. CHANGES OCCURING IN THE SECOND TRIMESTER OF PREGNANCY Your body goes through many changes during pregnancy. They vary from person to person. Talk to your caregiver about changes you notice that you are concerned about.  During the second trimester, you will likely have an increase in your appetite. It is normal to have cravings for certain foods. This varies from person to person and pregnancy to pregnancy.  Your lower abdomen will begin to bulge.  You may have to urinate more often because the uterus and baby are pressing on your bladder. It is also common to get more bladder infections during pregnancy. You can help this by drinking lots of fluids   and emptying your bladder before and after intercourse.  You may begin to get stretch marks on your hips, abdomen, and breasts. These are normal changes in the body during pregnancy. There are no exercises or medicines to take that prevent this change.  You may begin to develop swollen and bulging veins (varicose veins) in your legs. Wearing support hose, elevating your feet for 15 minutes, 3 to 4 times a day and limiting salt in your diet helps lessen the problem.  Heartburn may develop as the uterus grows and  pushes up against the stomach. Antacids recommended by your caregiver helps with this problem. Also, eating smaller meals 4 to 5 times a day helps.  Constipation can be treated with a stool softener or adding bulk to your diet. Drinking lots of fluids, and eating vegetables, fruits, and whole grains are helpful.  Exercising is also helpful. If you have been very active up until your pregnancy, most of these activities can be continued during your pregnancy. If you have been less active, it is helpful to start an exercise program such as walking.  Hemorrhoids may develop at the end of the second trimester. Warm sitz baths and hemorrhoid cream recommended by your caregiver helps hemorrhoid problems.  Backaches may develop during this time of your pregnancy. Avoid heavy lifting, wear low heal shoes, and practice good posture to help with backache problems.  Some pregnant women develop tingling and numbness of their hand and fingers because of swelling and tightening of ligaments in the wrist (carpel tunnel syndrome). This goes away after the baby is born.  As your breasts enlarge, you may have to get a bigger bra. Get a comfortable, cotton, support bra. Do not get a nursing bra until the last month of the pregnancy if you will be nursing the baby.  You may get a dark line from your belly button to the pubic area called the linea nigra.  You may develop rosy cheeks because of increase blood flow to the face.  You may develop spider looking lines of the face, neck, arms, and chest. These go away after the baby is born. HOME CARE INSTRUCTIONS   It is extremely important to avoid all smoking, herbs, alcohol, and unprescribed drugs during your pregnancy. These chemicals affect the formation and growth of the baby. Avoid these chemicals throughout the pregnancy to ensure the delivery of a healthy infant.  Most of your home care instructions are the same as suggested for the first trimester of your  pregnancy. Keep your caregiver's appointments. Follow your caregiver's instructions regarding medicine use, exercise, and diet.  During pregnancy, you are providing food for you and your baby. Continue to eat regular, well-balanced meals. Choose foods such as meat, fish, milk and other low fat dairy products, vegetables, fruits, and whole-grain breads and cereals. Your caregiver will tell you of the ideal weight gain.  A physical sexual relationship may be continued up until near the end of pregnancy if there are no other problems. Problems could include early (premature) leaking of amniotic fluid from the membranes, vaginal bleeding, abdominal pain, or other medical or pregnancy problems.  Exercise regularly if there are no restrictions. Check with your caregiver if you are unsure of the safety of some of your exercises. The greatest weight gain will occur in the last 2 trimesters of pregnancy. Exercise will help you:  Control your weight.  Get you in shape for labor and delivery.  Lose weight after you have the baby.  Wear   a good support or jogging bra for breast tenderness during pregnancy. This may help if worn during sleep. Pads or tissues may be used in the bra if you are leaking colostrum.  Do not use hot tubs, steam rooms or saunas throughout the pregnancy.  Wear your seat belt at all times when driving. This protects you and your baby if you are in an accident.  Avoid raw meat, uncooked cheese, cat litter boxes, and soil used by cats. These carry germs that can cause birth defects in the baby.  The second trimester is also a good time to visit your dentist for your dental health if this has not been done yet. Getting your teeth cleaned is okay. Use a soft toothbrush. Brush gently during pregnancy.  It is easier to leak urine during pregnancy. Tightening up and strengthening the pelvic muscles will help with this problem. Practice stopping your urination while you are going to the  bathroom. These are the same muscles you need to strengthen. It is also the muscles you would use as if you were trying to stop from passing gas. You can practice tightening these muscles up 10 times a set and repeating this about 3 times per day. Once you know what muscles to tighten up, do not perform these exercises during urination. It is more likely to contribute to an infection by backing up the urine.  Ask for help if you have financial, counseling, or nutritional needs during pregnancy. Your caregiver will be able to offer counseling for these needs as well as refer you for other special needs.  Your skin may become oily. If so, wash your face with mild soap, use non-greasy moisturizer and oil or cream based makeup. MEDICINES AND DRUG USE IN PREGNANCY  Take prenatal vitamins as directed. The vitamin should contain 1 milligram of folic acid. Keep all vitamins out of reach of children. Only a couple vitamins or tablets containing iron may be fatal to a baby or young child when ingested.  Avoid use of all medicines, including herbs, over-the-counter medicines, not prescribed or suggested by your caregiver. Only take over-the-counter or prescription medicines for pain, discomfort, or fever as directed by your caregiver. Do not use aspirin.  Let your caregiver also know about herbs you may be using.  Alcohol is related to a number of birth defects. This includes fetal alcohol syndrome. All alcohol, in any form, should be avoided completely. Smoking will cause low birth rate and premature babies.  Street or illegal drugs are very harmful to the baby. They are absolutely forbidden. A baby born to an addicted mother will be addicted at birth. The baby will go through the same withdrawal an adult does. SEEK MEDICAL CARE IF:  You have any concerns or worries during your pregnancy. It is better to call with your questions if you feel they cannot wait, rather than worry about them. SEEK IMMEDIATE  MEDICAL CARE IF:   An unexplained oral temperature above 102 F (38.9 C) develops, or as your caregiver suggests.  You have leaking of fluid from the vagina (birth canal). If leaking membranes are suspected, take your temperature and tell your caregiver of this when you call.  There is vaginal spotting, bleeding, or passing clots. Tell your caregiver of the amount and how many pads are used. Light spotting in pregnancy is common, especially following intercourse.  You develop a bad smelling vaginal discharge with a change in the color from clear to white.  You continue to feel   sick to your stomach (nauseated) and have no relief from remedies suggested. You vomit blood or coffee ground-like materials.  You lose more than 2 pounds of weight or gain more than 2 pounds of weight over 1 week, or as suggested by your caregiver.  You notice swelling of your face, hands, feet, or legs.  You get exposed to German measles and have never had them.  You are exposed to fifth disease or chickenpox.  You develop belly (abdominal) pain. Round ligament discomfort is a common non-cancerous (benign) cause of abdominal pain in pregnancy. Your caregiver still must evaluate you.  You develop a bad headache that does not go away.  You develop fever, diarrhea, pain with urination, or shortness of breath.  You develop visual problems, blurry, or double vision.  You fall or are in a car accident or any kind of trauma.  There is mental or physical violence at home. Document Released: 09/23/2001 Document Revised: 06/23/2012 Document Reviewed: 03/28/2009 [employer] Patient Information 2014 [employer].  Breastfeeding A change in hormones during your pregnancy causes growth of your breast tissue and an increase in number and size of milk ducts. The hormone prolactin allows proteins, sugars, and fats from your blood supply to make breast milk in your milk-producing glands. The hormone progesterone prevents  breast milk from being released before the birth of your baby. After the birth of your baby, your progesterone level decreases allowing breast milk to be released. Thoughts of your baby, as well as his or her sucking or crying, can stimulate the release of milk from the milk-producing glands. Deciding to breastfeed (nurse) is one of the best choices you can make for you and your baby. The information that follows gives a brief review of the benefits, as well as other important skills to know about breastfeeding. BENEFITS OF BREASTFEEDING For your baby  The first milk (colostrum) helps your baby's digestive system function better.   There are antibodies in your milk that help your baby fight off infections.   Your baby has a lower incidence of asthma, allergies, and sudden infant death syndrome (SIDS).   The nutrients in breast milk are better for your baby than infant formulas.  Breast milk improves your baby's brain development.   Your baby will have less gas, colic, and constipation.  Your baby is less likely to develop other conditions, such as childhood obesity, asthma, or diabetes mellitus. For you  Breastfeeding helps develop a very special bond between you and your baby.   Breastfeeding is convenient, always available at the correct temperature, and costs nothing.   Breastfeeding helps to burn calories and helps you lose the weight gained during pregnancy.   Breastfeeding makes your uterus contract back down to normal size faster and slows bleeding following delivery.   Breastfeeding mothers have a lower risk of developing osteoporosis or breast or ovarian cancer later in life.  BREASTFEEDING FREQUENCY  A healthy, full-term baby may breastfeed as often as every hour or space his or her feedings to every 3 hours. Breastfeeding frequency will vary from baby to baby.   Newborns should be fed no less than every 2 3 hours during the day and every 4 5 hours during the  night. You should breastfeed a minimum of 8 feedings in a 24 hour period.  Awaken your baby to breastfeed if it has been 3 4 hours since the last feeding.  Breastfeed when you feel the need to reduce the fullness of your breasts or when   your newborn shows signs of hunger. Signs that your baby may be hungry include:  Increased alertness or activity.  Stretching.  Movement of the head from side to side.  Movement of the head and opening of the mouth when the corner of the mouth or cheek is stroked (rooting).  Increased sucking sounds, smacking lips, cooing, sighing, or squeaking.  Hand-to-mouth movements.  Increased sucking of fingers or hands.  Fussing.  Intermittent crying.  Signs of extreme hunger will require calming and consoling before you try to feed your baby. Signs of extreme hunger may include:  Restlessness.  A loud, strong cry.  Screaming.  Frequent feeding will help you make more milk and will help prevent problems, such as sore nipples and engorgement of the breasts.  BREASTFEEDING   Whether lying down or sitting, be sure that the baby's abdomen is facing your abdomen.   Support your breast with 4 fingers under your breast and your thumb above your nipple. Make sure your fingers are well away from your nipple and your baby's mouth.   Stroke your baby's lips gently with your finger or nipple.   When your baby's mouth is open wide enough, place all of your nipple and as much of the colored area around your nipple (areola) as possible into your baby's mouth.  More areola should be visible above his or her upper lip than below his or her lower lip.  Your baby's tongue should be between his or her lower gum and your breast.  Ensure that your baby's mouth is correctly positioned around the nipple (latched). Your baby's lips should create a seal on your breast.  Signs that your baby has effectively latched onto your nipple include:  Tugging or sucking  without pain.  Swallowing heard between sucks.  Absent click or smacking sound.  Muscle movement above and in front of his or her ears with sucking.  Your baby must suck about 2 3 minutes in order to get your milk. Allow your baby to feed on each breast as long as he or she wants. Nurse your baby until he or she unlatches or falls asleep at the first breast, then offer the second breast.  Signs that your baby is full and satisfied include:  A gradual decrease in the number of sucks or complete cessation of sucking.  Falling asleep.  Extension or relaxation of his or her body.  Retention of a small amount of milk in his or her mouth.  Letting go of your breast by himself or herself.  Signs of effective breastfeeding in you include:  Breasts that have increased firmness, weight, and size prior to feeding.  Breasts that are softer after nursing.  Increased milk volume, as well as a change in milk consistency and color by the 5th day of breastfeeding.  Breast fullness relieved by breastfeeding.  Nipples are not sore, cracked, or bleeding.  If needed, break the suction by putting your finger into the corner of your baby's mouth and sliding your finger between his or her gums. Then, remove your breast from his or her mouth.  It is common for babies to spit up a small amount after a feeding.  Babies often swallow air during feeding. This can make babies fussy. Burping your baby between breasts can help with this.  Vitamin D supplements are recommended for babies who get only breast milk.  Avoid using a pacifier during your baby's first 4 6 weeks.  Avoid supplemental feedings of water, formula, or   juice in place of breastfeeding. Breast milk is all the food your baby needs. It is not necessary for your baby to have water or formula. Your breasts will make more milk if supplemental feedings are avoided during the early weeks. HOW TO TELL WHETHER YOUR BABY IS GETTING ENOUGH BREAST  MILK Wondering whether or not your baby is getting enough milk is a common concern among mothers. You can be assured that your baby is getting enough milk if:   Your baby is actively sucking and you hear swallowing.   Your baby seems relaxed and satisfied after a feeding.   Your baby nurses at least 8 12 times in a 24 hour time period.  During the first 3 5 days of age:  Your baby is wetting at least 3 5 diapers in a 24 hour period. The urine should be clear and pale yellow.  Your baby is having at least 3 4 stools in a 24 hour period. The stool should be soft and yellow.  At 5 7 days of age, your baby is having at least 3 6 stools in a 24 hour period. The stool should be seedy and yellow by 5 days of age.  Your baby has a weight loss less than 7 10% during the first 3 days of age.  Your baby does not lose weight after 3 7 days of age.  Your baby gains 4 7 ounces each week after he or she is 4 days of age.  Your baby gains weight by 5 days of age and is back to birth weight within 2 weeks. ENGORGEMENT In the first week after your baby is born, you may experience extremely full breasts (engorgement). When engorged, your breasts may feel heavy, warm, or tender to the touch. Engorgement peaks within 24 48 hours after delivery of your baby.  Engorgement may be reduced by:  Continuing to breastfeed.  Increasing the frequency of breastfeeding.  Taking warm showers or applying warm, moist heat to your breasts just before each feeding. This increases circulation and helps the milk flow.   Gently massaging your breast before and during the feedings. With your fingertips, massage from your chest wall towards your nipple in a circular motion.   Ensuring that your baby empties at least one breast at every feeding. It also helps to start the next feeding on the opposite breast.   Expressing breast milk by hand or by using a breast pump to empty the breasts if your baby is sleepy, or  not nursing well. You may also want to express milk if you are returning to work oryou feel you are getting engorged.  Ensuring your baby is latched on and positioned properly while breastfeeding. If you follow these suggestions, your engorgement should improve in 24 48 hours. If you are still experiencing difficulty, call your lactation consultant or caregiver.  CARING FOR YOURSELF Take care of your breasts.  Bathe or shower daily.   Avoid using soap on your nipples.   Wear a supportive bra. Avoid wearing underwire style bras.  Air dry your nipples for a 3 4minutes after each feeding.   Use only cotton bra pads to absorb breast milk leakage. Leaking of breast milk between feedings is normal.   Use only pure lanolin on your nipples after nursing. You do not need to wash it off before feeding your baby again. Another option is to express a few drops of breast milk and gently massage that milk into your nipples.  Continue   breast self-awareness checks. Take care of yourself.  Eat healthy foods. Alternate 3 meals with 3 snacks.  Avoid foods that you notice affect your baby in a bad way.  Drink milk, fruit juice, and water to satisfy your thirst (about 8 glasses a day).   Rest often, relax, and take your prenatal vitamins to prevent fatigue, stress, and anemia.  Avoid chewing and smoking tobacco.  Avoid alcohol and drug use.  Take over-the-counter and prescribed medicine only as directed by your caregiver or pharmacist. You should always check with your caregiver or pharmacist before taking any new medicine, vitamin, or herbal supplement.  Know that pregnancy is possible while breastfeeding. If desired, talk to your caregiver about family planning and safe birth control methods that may be used while breastfeeding. SEEK MEDICAL CARE IF:   You feel like you want to stop breastfeeding or have become frustrated with breastfeeding.  You have painful breasts or nipples.  Your  nipples are cracked or bleeding.  Your breasts are red, tender, or warm.  You have a swollen area on either breast.  You have a fever or chills.  You have nausea or vomiting.  You have drainage from your nipples.  Your breasts do not become full before feedings by the 5th day after delivery.  You feel sad and depressed.  Your baby is too sleepy to eat well.  Your baby is having trouble sleeping.   Your baby is wetting less than 3 diapers in a 24 hour period.  Your baby has less than 3 stools in a 24 hour period.  Your baby's skin or the white part of his or her eyes becomes more yellow.   Your baby is not gaining weight by 5 days of age. MAKE SURE YOU:   Understand these instructions.  Will watch your condition.  Will get help right away if you are not doing well or get worse. Document Released: 09/29/2005 Document Revised: 06/23/2012 Document Reviewed: 05/05/2012 [employer] Patient Information 2014 [employer].  

## 2013-04-07 NOTE — Progress Notes (Signed)
Nutrition Note:  1st visit Hx of HTN in previous pregnancy Wt gain wnl at 22+[redacted] weeks gestation.   Diet appears adequate.  Drinks whole milk.  No N/V.  Heartburn.  No PNV currently - was waiting for medicaid approval. Discussed wt gain goals of 11-20#.  Discussed importance of PNV daily. Pt given written and verbal education on healthy eating during pregnancy and tips for heartburn [facility] certification done today. F/U if needed. Candice C. Earlene Plater, MPH, RD, LDN

## 2013-04-07 NOTE — Progress Notes (Signed)
Pulse- 97  Edema-feet  Pain/pressure-lower abd New ob packet given Weight gain 11-20lbs

## 2013-04-07 NOTE — Addendum Note (Signed)
Addended by: Franchot Mimes on: 04/07/2013 11:17 AM   Modules accepted: Orders

## 2013-04-07 NOTE — Progress Notes (Signed)
   Subjective:    Lynn Lynch is a G3P1011 [redacted]w[redacted]d being seen today for her first obstetrical visit.  Her obstetrical history is significant for GHTN in last pregnancy; term SVD. Patient does intend to breast feed. Pregnancy history fully reviewed.  Patient reports BLE edema, no other concerns.  Filed Vitals:   04/07/13 0956  BP: 136/80  Temp: 98.5 F (36.9 C)  Weight: 208 lb 8 oz (94.575 kg)    HISTORY: OB History   Grav Para Term Preterm Abortions TAB SAB Ect Mult Living   3 1 1  0 1 0 1 0 0 1     # Outc Date GA Lbr Len/2nd Wgt Sex Del Anes PTL Lv   1 TRM 11/12 [redacted]w[redacted]d  8lb1oz(3.657kg) M SVD EPI  Yes   Comments: PIH   2 SAB 2013           3 CUR              Past Medical History  Diagnosis Date  . Gestational hypertension    Past Surgical History  Procedure Laterality Date  . No past surgeries     Family History  Problem Relation Age of Onset  . Hypertension Mother     Exam    Uterus:     Pelvic Exam:    Perineum: No Hemorrhoids, Normal Perineum   Vulva: normal   Vagina:  normal mucosa, curdlike discharge, wet prep done   Cervix: multiparous appearance and friable, small amount of bleeding following pap   Adnexa: normal adnexa and no mass, fullness, tenderness   Bony Pelvis: average and proven to 8-1  System: Breast:  normal appearance, no masses or tenderness   Skin: normal coloration and turgor, no rashes   Neurologic: oriented, normal   Extremities: normal strength, tone, and muscle mass   HEENT PERRLA and extra ocular movement intact   Mouth/Teeth mucous membranes moist, pharynx normal without lesions and dental hygiene good   Neck supple and no masses   Cardiovascular: regular rate and rhythm   Respiratory:  appears well, vitals normal, no respiratory distress, acyanotic, normal RR   Abdomen: soft, non-tender; bowel sounds normal; no masses,  no organomegaly   Urinary: urethral meatus normal     Assessment:    Pregnancy: [id number] Patient Active  Problem List   Diagnosis Date Noted  . Late prenatal care 04/07/2013     Plan:   Initial labs reviewed, 1 hr GTT done today Follow up wet prep and manage accordingly Continue prenatal vitamins. Problem list reviewed and updated. Ultrasound discussed; fetal survey: ordered. Follow up in 4 weeks.   Tereso Newcomer , M.D.  04/07/2013

## 2013-04-08 ENCOUNTER — Ambulatory Visit (HOSPITAL_COMMUNITY)
Admission: RE | Admit: 2013-04-08 | Discharge: 2013-04-08 | Disposition: A | Discharge status: Home / Self Care | Payer: Medicaid Other | Source: Ambulatory Visit | Attending: Obstetrics & Gynecology | Admitting: Obstetrics & Gynecology

## 2013-04-08 ENCOUNTER — Encounter: Payer: Self-pay | Admitting: Obstetrics & Gynecology

## 2013-04-08 ENCOUNTER — Encounter: Payer: Self-pay | Admitting: *Deleted

## 2013-04-08 DIAGNOSIS — Z3689 Encounter for other specified antenatal screening: Secondary | ICD-10-CM | POA: Insufficient documentation

## 2013-04-08 DIAGNOSIS — O0932 Supervision of pregnancy with insufficient antenatal care, second trimester: Secondary | ICD-10-CM

## 2013-04-08 LAB — WET PREP, GENITAL

## 2013-04-08 MED ORDER — FLUCONAZOLE 150 MG PO TABS: 150.0000 mg | ORAL_TABLET | Freq: Once | ORAL | Status: DC

## 2013-04-08 NOTE — Addendum Note (Signed)
Addended by: Jaynie Collins A on: 04/08/2013 11:10 AM   Modules accepted: Orders

## 2013-04-12 ENCOUNTER — Encounter: Payer: Self-pay | Admitting: Obstetrics & Gynecology

## 2013-04-12 MED ORDER — AZITHROMYCIN 500 MG PO TABS: 1000.0000 mg | ORAL_TABLET | Freq: Once | ORAL | Status: DC

## 2013-04-12 NOTE — Addendum Note (Signed)
Addended by: Jaynie Collins A on: 04/12/2013 03:12 PM   Modules accepted: Orders

## 2013-04-13 ENCOUNTER — Telehealth: Payer: Self-pay | Admitting: Obstetrics and Gynecology

## 2013-04-13 NOTE — Telephone Encounter (Addendum)
Message copied by Toula Moos on Wed Apr 13, 2013  8:28 AM ------      Message from: Odelia Gage A      Created: Tue Apr 12, 2013  3:39 PM                   ----- Message -----         From: Tereso Newcomer, MD         Sent: 04/12/2013   3:11 PM           To: Pennie Banter, PA-C, [facility] Admin Pool            Normal pap, negative GC but POSITIVE Chlamydia. Patient needs to be informed of diagnosis, needs treatment (Azithromycin 1g po x 1 - ordered) and needs to inform sexual partner(s). Please call to inform patient of results and inform her to pick up prescription.       ------  CALLED PATIENT TO NOTIFY ABOVE RESULT AND rx. UNABLE TO LEAVE MESSAGE; NO VOICEMAIL AVAILABLE. pls try again.

## 2013-04-14 ENCOUNTER — Encounter (HOSPITAL_COMMUNITY): Payer: Self-pay | Admitting: *Deleted

## 2013-04-14 NOTE — Telephone Encounter (Signed)
Called patient and some woman answered and stated she wasn't available and didn't know when she would be back in- told her to ask the patient to call us back.

## 2013-04-18 NOTE — Telephone Encounter (Signed)
Called patient and informed her of results and need to pick up medicine at [facility] and that it is very important she get treated and that her partner(s) get treated as well and to avoid intercourse for one week while getting treated. Patient verbalized understanding to all and had no further questions

## 2013-05-05 ENCOUNTER — Ambulatory Visit (INDEPENDENT_AMBULATORY_CARE_PROVIDER_SITE_OTHER): Payer: Medicaid Other | Admitting: Family

## 2013-05-05 VITALS — BP 111/78 | Temp 97.0°F | Wt 210.6 lb

## 2013-05-05 DIAGNOSIS — A749 Chlamydial infection, unspecified: Secondary | ICD-10-CM

## 2013-05-05 DIAGNOSIS — O98319 Other infections with a predominantly sexual mode of transmission complicating pregnancy, unspecified trimester: Secondary | ICD-10-CM

## 2013-05-05 LAB — POCT URINALYSIS DIP (DEVICE)
Ketones, ur: NEGATIVE mg/dL
Protein, ur: NEGATIVE mg/dL
Specific Gravity, Urine: >=1.03 (ref 1.005–1.030)
pH: 6 (ref 5.0–8.0)

## 2013-05-05 NOTE — Progress Notes (Signed)
Reports partner treated for chlamydia after her treatment on 05/02/13.  No burning with urination; large leuks in urine, send for urine culture.

## 2013-05-05 NOTE — Progress Notes (Signed)
P=71,  c/o pain in pelvic and right upper quadrant. States she thinks she is having contractions 2 times day.

## 2013-05-06 LAB — CULTURE, OB URINE

## 2013-05-24 ENCOUNTER — Encounter: Payer: Self-pay | Admitting: *Deleted

## 2013-05-26 ENCOUNTER — Encounter: Payer: Self-pay | Admitting: Obstetrics & Gynecology

## 2013-06-02 ENCOUNTER — Encounter (HOSPITAL_COMMUNITY): Payer: Self-pay | Admitting: *Deleted

## 2013-06-02 ENCOUNTER — Inpatient Hospital Stay (HOSPITAL_COMMUNITY)
Admission: AD | Admit: 2013-06-02 | Discharge: 2013-06-02 | Disposition: A | Discharge status: Home / Self Care | Payer: Medicaid Other | Source: Ambulatory Visit | Attending: Obstetrics & Gynecology | Admitting: Obstetrics & Gynecology

## 2013-06-02 DIAGNOSIS — A599 Trichomoniasis, unspecified: Secondary | ICD-10-CM

## 2013-06-02 DIAGNOSIS — O98819 Other maternal infectious and parasitic diseases complicating pregnancy, unspecified trimester: Secondary | ICD-10-CM | POA: Insufficient documentation

## 2013-06-02 DIAGNOSIS — O368190 Decreased fetal movements, unspecified trimester, not applicable or unspecified: Secondary | ICD-10-CM | POA: Insufficient documentation

## 2013-06-02 DIAGNOSIS — A5901 Trichomonal vulvovaginitis: Secondary | ICD-10-CM | POA: Insufficient documentation

## 2013-06-02 DIAGNOSIS — M549 Dorsalgia, unspecified: Secondary | ICD-10-CM | POA: Insufficient documentation

## 2013-06-02 LAB — WET PREP, GENITAL: Clue Cells Wet Prep HPF POC: NONE SEEN

## 2013-06-02 LAB — URINALYSIS, ROUTINE W REFLEX MICROSCOPIC
Bilirubin Urine: NEGATIVE
Glucose, UA: NEGATIVE mg/dL
Specific Gravity, Urine: >1.03 — ABNORMAL HIGH (ref 1.005–1.030)
pH: 6 (ref 5.0–8.0)

## 2013-06-02 LAB — URINE MICROSCOPIC-ADD ON

## 2013-06-02 MED ORDER — METRONIDAZOLE 500 MG PO TABS
2000.0000 mg | ORAL_TABLET | Freq: Once | ORAL | Status: AC
  Administered 2013-06-02: 2000 mg via ORAL
  Filled 2013-06-02: qty 4

## 2013-06-02 NOTE — MAU Provider Note (Signed)
Attestation of Attending Supervision of Fellow: Evaluation and management procedures were performed by the Fellow under my supervision and collaboration.  I have reviewed the Fellow's note and chart, and I agree with the management and plan.    

## 2013-06-02 NOTE — MAU Provider Note (Signed)
History     CSN: [id number]  Arrival date and time: 06/02/13 4098   First Provider Initiated Contact with Patient 06/02/13 1056      Chief Complaint  Patient presents with  . Back Pain  . Decreased Fetal Movement   HPI PT is 22yo G3P1011 at [redacted]w[redacted]d with a h/o treated Chlamydia at 23 weeks complaining of vaginal discharge, back pain and decreased FM.  She does not know if her sexual partner was treated for Chlamydia and has had intercourse with him since.  Believes he did not have treatment. She's had this whitish-green vaginal discharge since the beginning of the week that itches and is experiencing vaginal pain and dyspareunia x 2 weeks.  She also c/o dysuria, urinary frequency and urgency.  Denies fever, chills, nausea and vomiting.  She also describes sharp lower back pain followed by lower pelvic pain that started yesterday.  She is concerned that there has been decreased fetal movement but admits to not counting kicks and could not quantify. She has a h/o GHTN in a previous pregnancy but has not had elevated pressures this pregnancy.  Reports occassional headaches, but no vision changes or RUQ pain.  OB History   Grav Para Term Preterm Abortions TAB SAB Ect Mult Living   3 1 1  0 1 0 1 0 0 1      Past Medical History  Diagnosis Date  . Gestational hypertension     Past Surgical History  Procedure Laterality Date  . No past surgeries      Family History  Problem Relation Age of Onset  . Hypertension Mother     History  Substance Use Topics  . Smoking status: Former Smoker    Quit date: 10/21/2011  . Smokeless tobacco: Never Used  . Alcohol Use: No    Allergies: No Known Allergies  Prescriptions prior to admission  Medication Sig Dispense Refill  . Prenatal Vit-Fe Fumarate-FA (PRENATAL MULTIVITAMIN) TABS tablet Take 1 tablet by mouth daily at 12 noon.        ROS Physical Exam   Blood pressure 124/64, pulse 100, temperature 98.4 F (36.9 C), temperature source  Oral, resp. rate 18, height 5' 2.5" (1.588 m), weight 99.428 kg (219 lb 3.2 oz), last menstrual period 10/30/2012, SpO2 100.00%.  Physical Exam  Constitutional: She is oriented to person, place, and time. She appears well-developed and well-nourished. No distress.  Lying quietly and comfortably in bed with son.  HENT:  Head: Normocephalic and atraumatic.  Cardiovascular: Normal rate, regular rhythm and normal heart sounds.   Respiratory: Effort normal and breath sounds normal.  GI: Soft. There is tenderness (mild tenderness in suprapubic region). There is no rebound and no guarding.  Gravid.  Genitourinary: There is no rash, tenderness, lesion or injury on the right labia. There is no rash, tenderness, lesion or injury on the left labia. Uterus is not tender. Cervix exhibits friability. Cervix exhibits no motion tenderness. Right adnexum displays no mass and no tenderness. Left adnexum displays no mass and no tenderness. There is tenderness around the vagina. No erythema or bleeding around the vagina. Vaginal discharge (whitish-yellow mucous discharge) found.  Neurological: She is alert and oriented to person, place, and time.  Skin: Skin is warm and dry.  Psychiatric: She has a normal mood and affect. Her behavior is normal.   Cervix 0/thick Fetal monitor: 145 bpm, mod variability, accels present, no decels Category I tracing Toco: minimal, no ctx  MAU Course  Procedures Wet prep GC/Chlamydia  UA  Results for orders placed during the hospital encounter of 06/02/13 (from the past 24 hour(s))  URINALYSIS, ROUTINE W REFLEX MICROSCOPIC     Status: Abnormal   Collection Time    06/02/13  9:09 AM      Result Value Range   Color, Urine YELLOW  YELLOW   APPearance CLEAR  CLEAR   Specific Gravity, Urine >1.030 (*) 1.005 - 1.030   pH 6.0  5.0 - 8.0   Glucose, UA NEGATIVE  NEGATIVE mg/dL   Hgb urine dipstick TRACE (*) NEGATIVE   Bilirubin Urine NEGATIVE  NEGATIVE   Ketones, ur NEGATIVE   NEGATIVE mg/dL   Protein, ur NEGATIVE  NEGATIVE mg/dL   Urobilinogen, UA 0.2  0.0 - 1.0 mg/dL   Nitrite NEGATIVE  NEGATIVE   Leukocytes, UA MODERATE (*) NEGATIVE  URINE MICROSCOPIC-ADD ON     Status: Abnormal   Collection Time    06/02/13  9:09 AM      Result Value Range   Squamous Epithelial / LPF MANY (*) RARE   WBC, UA 21-50  <3 WBC/hpf   RBC / HPF 0-2  <3 RBC/hpf   Bacteria, UA MANY (*) RARE   Urine-Other TRICHOMONAS PRESENT    WET PREP, GENITAL     Status: Abnormal   Collection Time    06/02/13 11:10 AM      Result Value Range   Yeast Wet Prep HPF POC FEW (*) NONE SEEN   Trich, Wet Prep FEW (*) NONE SEEN   Clue Cells Wet Prep HPF POC NONE SEEN  NONE SEEN   WBC, Wet Prep HPF POC MODERATE (*) NONE SEEN     MDM   Assessment and Plan  A:  Trichomonas       High-risk STI  P:  Flagyl 2gm PO x1       Educate patient on STI prevention and Tx      Will follow-up with GC/Chlamydia as needed       Keep prenatal appointments  Garnette Czech 06/02/2013, 10:59 AM   I have seen and examined this patient and agree with above documentation in the PA student's note. Pt presents with vaginal discharge and a hx of chlamydia s/p treatment but without partner being treated. GC/CHL sent. No CMT and cervix slightly friable.  Wet prep + for trich- treated here. Suspect also chlamydia but will wait for culture given hx of treatment.  Long discussion held with pt about need for partner to be tested as now she ahs 2 different STDs in the past 2 months and need for treatment prior to intercourse. Pt voiced understanding. WILL NEED TEST OF CURE.   Rulon Abide, M.D. [facility] Fellow 06/02/2013 3:23 PM

## 2013-06-02 NOTE — MAU Note (Signed)
Patient states she has been having back pain since yesterday. States having some contractions. Denies bleeding or leaking. Reports less than usual fetal movement.

## 2013-06-03 LAB — GC/CHLAMYDIA PROBE AMP: CT Probe RNA: POSITIVE — AB

## 2013-06-03 LAB — URINE CULTURE

## 2013-06-08 ENCOUNTER — Telehealth: Payer: Self-pay | Admitting: *Deleted

## 2013-06-08 NOTE — Telephone Encounter (Signed)
Called [place] and spoke with a female who said Erandi is not in. Asked her to have Syla call the clinic

## 2013-06-08 NOTE — Telephone Encounter (Signed)
CT Probe RNA NEGATIVE  POSITIVE (A)    POSITIVE (A)

## 2013-06-08 NOTE — Telephone Encounter (Signed)
Health Department report started

## 2013-06-08 NOTE — Telephone Encounter (Signed)
Message copied by Gerome Apley on Wed Jun 08, 2013  1:50 PM ------      Message from: Mayra Neer P      Created: Mon Jun 06, 2013  2:09 PM      Regarding: Please call patient       Please call patient with results and treat.      ----- Message -----         From: Lab In [place] Interface         Sent: 06/03/2013   5:44 PM           To: [facility] Results                   ------

## 2013-06-09 ENCOUNTER — Encounter: Payer: Self-pay | Admitting: Advanced Practice Midwife

## 2013-06-09 ENCOUNTER — Telehealth: Payer: Self-pay | Admitting: General Practice

## 2013-06-09 MED ORDER — AZITHROMYCIN 250 MG PO TABS: ORAL_TABLET | ORAL | Status: DC

## 2013-06-09 NOTE — Telephone Encounter (Signed)
Called patient, no answer- left message that we are trying to reach you and to please call us back at the clinics

## 2013-06-09 NOTE — Telephone Encounter (Signed)
Message copied by Kathee Delton on Thu Jun 09, 2013  5:06 PM ------      Message from: Willodean Rosenthal      Created: Tue Jun 07, 2013  7:56 AM       Please call pt.  She needs a repeat urine cx if she still has sx.            Thx,      clh-S ------

## 2013-06-09 NOTE — Telephone Encounter (Signed)
Spoke w/pt today @ [phone] when she called to cancel her scheduled appt for today. I informed her of +chlamydia results and need for treatment. She voiced understanding.  Rx sent to pt's pharmacy. Pt was also advised that her partner requires treatment. She should not have intercourse until 1 week after her treatment and should not have intercourse with current partner until 1 week after his treatment.  Pt voiced understanding of all information and instructions.

## 2013-06-09 NOTE — Telephone Encounter (Signed)
Called [facility]- spoke to female who said she gave Cozette the message to call us and Zenna is going to call us today

## 2013-06-10 NOTE — Telephone Encounter (Signed)
Called patient, no answer- left message that we are trying to reach you and to please call us back at the clinics and just to make you aware we are closed for the day now and will reopen on Tuesday morning

## 2013-06-15 ENCOUNTER — Encounter: Payer: Self-pay | Admitting: *Deleted

## 2013-06-15 NOTE — Telephone Encounter (Signed)
Pt scheduled for visit tomorrow. Note made in appt notes about urine culture.

## 2013-06-15 NOTE — Telephone Encounter (Signed)
Left message on voicemail to return call to clinic for important message. Patient have appt on 06/16/13.

## 2013-06-16 ENCOUNTER — Ambulatory Visit (INDEPENDENT_AMBULATORY_CARE_PROVIDER_SITE_OTHER): Payer: Medicaid Other | Admitting: Family

## 2013-06-16 VITALS — BP 114/74 | Wt 220.4 lb

## 2013-06-16 DIAGNOSIS — O0933 Supervision of pregnancy with insufficient antenatal care, third trimester: Secondary | ICD-10-CM

## 2013-06-16 DIAGNOSIS — O0930 Supervision of pregnancy with insufficient antenatal care, unspecified trimester: Secondary | ICD-10-CM

## 2013-06-16 LAB — POCT URINALYSIS DIP (DEVICE)
Ketones, ur: NEGATIVE mg/dL
Protein, ur: 100 mg/dL — AB
pH: 6 (ref 5.0–8.0)

## 2013-06-16 MED ORDER — AZITHROMYCIN 100 MG/5ML PO SUSR: 1000.0000 mg | Freq: Every day | ORAL | Status: DC

## 2013-06-16 NOTE — Progress Notes (Signed)
No questions or concerns; RX Azithromycin sent to pharmacy.  Need to sign tubal papers at next visit.

## 2013-06-16 NOTE — Progress Notes (Signed)
Pulse: 92 Pt states that she vomited the azithromycin.

## 2013-06-22 NOTE — Progress Notes (Signed)
Pt left without getting 1 hr blood drawn.  Need at next visit.

## 2013-06-25 ENCOUNTER — Inpatient Hospital Stay (HOSPITAL_COMMUNITY)
Admission: AD | Admit: 2013-06-25 | Discharge: 2013-06-25 | Disposition: A | Discharge status: Home / Self Care | Payer: Medicaid Other | Source: Ambulatory Visit | Attending: Obstetrics & Gynecology | Admitting: Obstetrics & Gynecology

## 2013-06-25 ENCOUNTER — Encounter (HOSPITAL_COMMUNITY): Payer: Self-pay | Admitting: *Deleted

## 2013-06-25 DIAGNOSIS — N739 Female pelvic inflammatory disease, unspecified: Secondary | ICD-10-CM | POA: Insufficient documentation

## 2013-06-25 DIAGNOSIS — A5619 Other chlamydial genitourinary infection: Secondary | ICD-10-CM | POA: Insufficient documentation

## 2013-06-25 DIAGNOSIS — M545 Low back pain: Secondary | ICD-10-CM

## 2013-06-25 DIAGNOSIS — B3731 Acute candidiasis of vulva and vagina: Secondary | ICD-10-CM | POA: Insufficient documentation

## 2013-06-25 DIAGNOSIS — O0930 Supervision of pregnancy with insufficient antenatal care, unspecified trimester: Secondary | ICD-10-CM | POA: Insufficient documentation

## 2013-06-25 DIAGNOSIS — R109 Unspecified abdominal pain: Secondary | ICD-10-CM | POA: Insufficient documentation

## 2013-06-25 DIAGNOSIS — A749 Chlamydial infection, unspecified: Secondary | ICD-10-CM

## 2013-06-25 DIAGNOSIS — O0933 Supervision of pregnancy with insufficient antenatal care, third trimester: Secondary | ICD-10-CM

## 2013-06-25 DIAGNOSIS — R1084 Generalized abdominal pain: Secondary | ICD-10-CM

## 2013-06-25 DIAGNOSIS — M549 Dorsalgia, unspecified: Secondary | ICD-10-CM | POA: Insufficient documentation

## 2013-06-25 DIAGNOSIS — O2390 Unspecified genitourinary tract infection in pregnancy, unspecified trimester: Secondary | ICD-10-CM | POA: Insufficient documentation

## 2013-06-25 DIAGNOSIS — B373 Candidiasis of vulva and vagina: Secondary | ICD-10-CM | POA: Insufficient documentation

## 2013-06-25 DIAGNOSIS — O98319 Other infections with a predominantly sexual mode of transmission complicating pregnancy, unspecified trimester: Secondary | ICD-10-CM | POA: Insufficient documentation

## 2013-06-25 LAB — URINALYSIS, ROUTINE W REFLEX MICROSCOPIC
Bilirubin Urine: NEGATIVE
Ketones, ur: NEGATIVE mg/dL
Specific Gravity, Urine: 1.02 (ref 1.005–1.030)
pH: 7.5 (ref 5.0–8.0)

## 2013-06-25 LAB — URINE MICROSCOPIC-ADD ON

## 2013-06-25 LAB — WET PREP, GENITAL: Trich, Wet Prep: NONE SEEN

## 2013-06-25 MED ORDER — AZITHROMYCIN 250 MG PO TABS
1000.0000 mg | ORAL_TABLET | Freq: Once | ORAL | Status: AC
  Administered 2013-06-25: 1000 mg via ORAL
  Filled 2013-06-25: qty 4

## 2013-06-25 MED ORDER — FLUCONAZOLE 150 MG PO TABS
150.0000 mg | ORAL_TABLET | Freq: Once | ORAL | Status: AC
  Administered 2013-06-25: 150 mg via ORAL
  Filled 2013-06-25: qty 1

## 2013-06-25 NOTE — MAU Provider Note (Signed)
Obstetric Attending MAU Note  Chief Complaint:  Abdominal Pain and Back Pain  First Provider Initiated Contact with Patient 06/25/13 1935     HPI: Lynn Lynch is a 22 y.o. G3P1011 at [redacted]w[redacted]d who presents to maternity admissions reporting back pain, lower abdominal pain and vaginal discharge. Of note, patient has a diagnosis of untreated Chlamydia infection, she did not get Azithromycin that was prescribed on 06/16/13.  She denies contractions, leakage of fluid or vaginal bleeding. Good fetal movement.   Pregnancy Course: Followed at the [facility] Patient Active Problem List   Diagnosis Date Noted  . Late prenatal care 04/07/2013  . Chlamydia infection, current pregnancy x 2 episodes 04/07/2013    Past Medical History  Diagnosis Date  . Gestational hypertension     OB History  Gravida Para Term Preterm AB SAB TAB Ectopic Multiple Living  3 1 1  0 1 1 0 0 0 1    # Outcome Date GA Lbr Len/2nd Weight Sex Delivery Anes PTL Lv  3 CUR           2 SAB 2013          1 TRM 09/06/11 [redacted]w[redacted]d  8 lb 1 oz (3.657 kg) M SVD EPI  Y     Comments: PIH      Past Surgical History  Procedure Laterality Date  . No past surgeries      Family History: Family History  Problem Relation Age of Onset  . Hypertension Mother     Social History: History  Substance Use Topics  . Smoking status: Former Smoker    Quit date: 10/21/2011  . Smokeless tobacco: Never Used  . Alcohol Use: No    Allergies: No Known Allergies  Prescriptions prior to admission  Medication Sig Dispense Refill  . Prenatal Vit-Fe Fumarate-FA (PRENATAL MULTIVITAMIN) TABS tablet Take 1 tablet by mouth daily at 12 noon.      . [DISCONTINUED] azithromycin (ZITHROMAX) 100 MG/5ML suspension Take 50 mLs (1,000 mg total) by mouth daily.  15 mL  0  . [DISCONTINUED] azithromycin (ZITHROMAX) 250 MG tablet Take 4 tablets by mouth once  4 tablet  0    ROS: Pertinent findings in history of present illness.  Physical Exam  Blood pressure  105/68, pulse 78, temperature 97.9 F (36.6 C), temperature source Oral, resp. rate 18, last menstrual period 10/30/2012. GENERAL: Well-developed, well-nourished female in no acute distress.  HEENT: Normocephalic, atraumatic ABDOMEN: Soft, non-tender, gravid appropriate for gestational age EXTREMITIES: Nontender, no edema NEURO: Alert and oriented PELVIC EXAM: NEFG, thick, yellow vaginal discharge seen and sample obtained for wet prep, no blood Dilation: Closed Effacement (%): Thick Exam by:: Dr. Macon Large  FHT:  Baseline 135 , moderate variability, accelerations present, no decelerations Contractions: None   Labs: Results for orders placed during the hospital encounter of 06/25/13 (from the past 24 hour(s))  URINALYSIS, ROUTINE W REFLEX MICROSCOPIC     Status: Abnormal   Collection Time    06/25/13  6:45 PM      Result Value Range   Color, Urine YELLOW  YELLOW   APPearance TURBID (*) CLEAR   Specific Gravity, Urine 1.020  1.005 - 1.030   pH 7.5  5.0 - 8.0   Glucose, UA NEGATIVE  NEGATIVE mg/dL   Hgb urine dipstick SMALL (*) NEGATIVE   Bilirubin Urine NEGATIVE  NEGATIVE   Ketones, ur NEGATIVE  NEGATIVE mg/dL   Protein, ur NEGATIVE  NEGATIVE mg/dL   Urobilinogen, UA 0.2  0.0 - 1.0  mg/dL   Nitrite NEGATIVE  NEGATIVE   Leukocytes, UA LARGE (*) NEGATIVE  URINE MICROSCOPIC-ADD ON     Status: Abnormal   Collection Time    06/25/13  6:45 PM      Result Value Range   Squamous Epithelial / LPF MANY (*) RARE   WBC, UA 11-20  <3 WBC/hpf   RBC / HPF 0-2  <3 RBC/hpf   Bacteria, UA MANY (*) RARE  WET PREP, GENITAL     Status: Abnormal   Collection Time    06/25/13  7:42 PM      Result Value Range   Yeast Wet Prep HPF POC MANY (*) NONE SEEN   Trich, Wet Prep NONE SEEN  NONE SEEN   Clue Cells Wet Prep HPF POC FEW (*) NONE SEEN   WBC, Wet Prep HPF POC FEW (*) NONE SEEN    MAU Course: 1945  Ordered Azithromycin 1 g po x 1. Awaiting results of wet prep and UA 2020 Wet prep showed many  yeast, Diflucan 150 mg po x 1 ordered. UA neg for infection  Assessment: 1. Late prenatal care, third trimester   2. Chlamydia infection, current pregnancy, third trimester   3. Vaginal yeast infection     Plan: Discharge home Patient already treated for chlamydia and yeast infection Labor precautions and fetal kick counts reviewed Patient instructed to inform her sexual partner(s) to be treated; advised safe sex until she is sure her partner(s) receives treatment. Discussed risks of chlamydia to the infant if it contracts it during delivery; infant can be infected with Chlamydia and have conjunctivitis and/or pneumonia that can be very serious. Follow up in clinic next week as scheduled; needs third trimester labs.  Follow-up Information   Follow up with [facility] On 06/30/2013. (10:15 am for routine appt.)    Contact information:   [address] [place] [address]       Tereso Newcomer, MD 06/25/2013 8:47 PM

## 2013-06-25 NOTE — MAU Note (Signed)
Pt reports she started having abd and back pain yesterday and vaginal discharge. Pain comes and goes like a cramp

## 2013-06-27 LAB — URINE CULTURE

## 2013-06-30 ENCOUNTER — Ambulatory Visit (INDEPENDENT_AMBULATORY_CARE_PROVIDER_SITE_OTHER): Payer: Medicaid Other | Admitting: Family Medicine

## 2013-06-30 ENCOUNTER — Encounter: Payer: Self-pay | Admitting: Family Medicine

## 2013-06-30 VITALS — BP 124/79 | Wt 220.0 lb

## 2013-06-30 DIAGNOSIS — O0933 Supervision of pregnancy with insufficient antenatal care, third trimester: Secondary | ICD-10-CM

## 2013-06-30 DIAGNOSIS — O0930 Supervision of pregnancy with insufficient antenatal care, unspecified trimester: Secondary | ICD-10-CM

## 2013-06-30 DIAGNOSIS — O98319 Other infections with a predominantly sexual mode of transmission complicating pregnancy, unspecified trimester: Secondary | ICD-10-CM

## 2013-06-30 DIAGNOSIS — A749 Chlamydial infection, unspecified: Secondary | ICD-10-CM

## 2013-06-30 DIAGNOSIS — Z23 Encounter for immunization: Secondary | ICD-10-CM

## 2013-06-30 LAB — POCT URINALYSIS DIP (DEVICE)
Bilirubin Urine: NEGATIVE
Glucose, UA: NEGATIVE mg/dL
Hgb urine dipstick: NEGATIVE
Ketones, ur: NEGATIVE mg/dL
Nitrite: NEGATIVE
Protein, ur: NEGATIVE mg/dL
Specific Gravity, Urine: >=1.03 (ref 1.005–1.030)
Urobilinogen, UA: 0.2 mg/dL (ref 0.0–1.0)
pH: 6.5 (ref 5.0–8.0)

## 2013-06-30 MED ORDER — TETANUS-DIPHTH-ACELL PERTUSSIS 5-2.5-18.5 LF-MCG/0.5 IM SUSP: 0.5000 mL | Freq: Once | INTRAMUSCULAR | Status: DC

## 2013-06-30 NOTE — Addendum Note (Signed)
Addended by: Franchot Mimes on: 06/30/2013 12:41 PM   Modules accepted: Orders

## 2013-06-30 NOTE — Patient Instructions (Signed)

## 2013-06-30 NOTE — Progress Notes (Signed)
  Subjective:    Lynn Lynch is a 22 y.o. female being seen today for her obstetrical visit. She is at [redacted]w[redacted]d gestation. Patient reports no bleeding, no contractions, no cramping and no leaking. Fetal movement: normal.  Tx for chlamydia infection on 9/13. Needs TOC in after 1week  Menstrual History: OB History   Grav Para Term Preterm Abortions TAB SAB Ect Mult Living   3 1 1  0 1 0 1 0 0 1       Patient's last menstrual period was 10/30/2012.    The following portions of the patient's history were reviewed and updated as appropriate: allergies, current medications, past family history, past medical history, past social history, past surgical history and problem list.  Review of Systems Pertinent items are noted in HPI.   Objective:    BP 124/79  Wt 99.791 kg (220 lb)  BMI 39.57 kg/m2  LMP 10/30/2012 FHT:  125 BPM  Uterine Size: 34 cm  Presentation:      Assessment:   Lynn Lynch is a 22 y.o. G3P1011 at 8w5dhere for ROB visit.  Discussed with Patient:  -Plans to breast feed.  All questions answered. -Continue prenatal vitamins. -Reviewed fetal kick counts. Pt to perform daily at a time when the baby is active, lie laterally with both hands on belly in quiet room and count all movements (hiccups, shoulder rolls, obvious kicks, etc); pt is to report to clinic L&D for less than 10 movements felt in a one hour time period-pt told as soon as she counts 10 movements the count is complete.  - Routine precautions discussed (depression, infection s/s).   Patient provided with all pertinent phone numbers for emergencies. - RTC for any VB, regular, painful cramps/ctxs occurring at a rate of >2/10 min, fever (100.5 or higher), n/v/d, any pain that is unresolving or worsening, LOF, decreased fetal movement, CP, SOB, edema - RTC in 2 weeks for next appt.  Problems: Patient Active Problem List   Diagnosis Date Noted  . Late prenatal care 04/07/2013  . Chlamydia infection,  current pregnancy x 2 episodes 04/07/2013    To Do: 1. Tdap will be given today  [x ] Vaccines: Flu:  Tdap:  [ x] BCM: BTL [ ]  Readiness: baby has a place to sleep, car seat, other baby necessities.  Edu: [x ] PTL precautions; [ ]  BF class; [ ]  childbirth class; [ ]   BF counseling;

## 2013-06-30 NOTE — Progress Notes (Signed)
Pulse: 101 Repeat 1hr today left without having it drawn last visit.  Needs to sign tubal papers today.

## 2013-07-01 ENCOUNTER — Encounter: Payer: Self-pay | Admitting: *Deleted

## 2013-07-01 DIAGNOSIS — O0930 Supervision of pregnancy with insufficient antenatal care, unspecified trimester: Secondary | ICD-10-CM

## 2013-07-01 LAB — CBC
Hemoglobin: 10.4 g/dL — ABNORMAL LOW (ref 12.0–15.0)
MCHC: 34.8 g/dL (ref 30.0–36.0)
RBC: 3.23 MIL/uL — ABNORMAL LOW (ref 3.87–5.11)
WBC: 6.3 10*3/uL (ref 4.0–10.5)

## 2013-07-01 LAB — GLUCOSE TOLERANCE, 1 HOUR (50G) W/O FASTING: Glucose, 1 Hour GTT: 106 mg/dL (ref 70–140)

## 2013-07-01 LAB — RPR

## 2013-07-01 LAB — HIV ANTIBODY (ROUTINE TESTING W REFLEX): HIV: NONREACTIVE

## 2013-07-04 ENCOUNTER — Encounter: Payer: Self-pay | Admitting: *Deleted

## 2013-07-07 ENCOUNTER — Ambulatory Visit (INDEPENDENT_AMBULATORY_CARE_PROVIDER_SITE_OTHER): Payer: Medicaid Other | Admitting: Obstetrics & Gynecology

## 2013-07-07 VITALS — BP 125/80 | Temp 98.8°F | Wt 224.5 lb

## 2013-07-07 DIAGNOSIS — O0930 Supervision of pregnancy with insufficient antenatal care, unspecified trimester: Secondary | ICD-10-CM

## 2013-07-07 DIAGNOSIS — O0933 Supervision of pregnancy with insufficient antenatal care, third trimester: Secondary | ICD-10-CM

## 2013-07-07 LAB — POCT URINALYSIS DIP (DEVICE)
Glucose, UA: NEGATIVE mg/dL
Ketones, ur: NEGATIVE mg/dL
Protein, ur: NEGATIVE mg/dL

## 2013-07-07 LAB — OB RESULTS CONSOLE GC/CHLAMYDIA: Chlamydia: NEGATIVE

## 2013-07-07 NOTE — Progress Notes (Signed)
P - 108 

## 2013-07-07 NOTE — Progress Notes (Signed)
GC and CT and GBS, cx check

## 2013-07-07 NOTE — Patient Instructions (Signed)
Vaginal Delivery  Your caregiver must first be sure you are in labor. Signs of labor include:   You may pass what is called "the mucus plug" before labor begins. This is a small amount of blood stained mucus.   Regular uterine contractions.   The time between contractions get closer together.   The discomfort and pain gradually gets more intense.   Pains are mostly located in the back.   Pains get worse when walking.   The cervix (the opening of the uterus) becomes thinner (begins to efface) and opens up (dilates).  Once you are in labor and admitted into the hospital or care center, your caregiver will do the following:   A complete physical examination.   Check your vital signs (blood pressure, pulse, temperature and the fetal heart rate).   Do a vaginal examination (using a sterile glove and lubricant) to determine:   The position (presentation) of the baby (head [vertex] or buttock first).   The level (station) of the baby's head in the birth canal.   The effacement and dilatation of the cervix.   You may have your pubic hair shaved and be given an enema depending on your caregiver and the circumstance.   An electronic monitor is usually placed on your abdomen. The monitor follows the length and intensity of the contractions, as well as the baby's heart rate.   Usually, your caregiver will insert an IV in your arm with a bottle of sugar water. This is done as a precaution so that medications can be given to you quickly during labor or delivery.  NORMAL LABOR AND DELIVERY IS DIVIDED UP INTO 3 STAGES:  First Stage  This is when regular contractions begin and the cervix begins to efface and dilate. This stage can last from 3 to 15 hours. The end of the first stage is when the cervix is 100% effaced and 10 centimeters dilated. Pain medications may be given by    Injection (morphine, demerol, etc.)    Regional anesthesia (spinal, caudal or epidural, anesthetics given in different locations of the spine). Paracervical pain medication may be given, which is an injection of and anesthetic on each side of the cervix.  A pregnant woman may request to have "Natural Childbirth" which is not to have any medications or anesthesia during her labor and delivery.  Second Stage  This is when the baby comes down through the birth canal (vagina) and is born. This can take 1 to 4 hours. As the baby's head comes down through the birth canal, you may feel like you are going to have a bowel movement. You will get the urge to bear down and push until the baby is delivered. As the baby's head is being delivered, the caregiver will decide if an episiotomy (a cut in the perineum and vagina area) is needed to prevent tearing of the tissue in this area. The episiotomy is sewn up after the delivery of the baby and placenta. Sometimes a mask with nitrous oxide is given for the mother to breath during the delivery of the baby to help if there is too much pain. The end of Stage 2 is when the baby is fully delivered. Then when the umbilical cord stops pulsating it is clamped and cut.  Third Stage  The third stage begins after the baby is completely delivered and ends after the placenta (afterbirth) is delivered. This usually takes 5 to 30 minutes. After the placenta is delivered, a medication is given   either by intravenous or injection to help contract the uterus and prevent bleeding. The third stage is not painful and pain medication is usually not necessary. If an episiotomy was done, it is repaired at this time.  After the delivery, the mother is watched and monitored closely for 1 to 2 hours to make sure there is no postpartum bleeding (hemorrhage). If there is a lot of bleeding, medication is given to contract the uterus and stop the bleeding.  Document Released: 07/08/2008 Document Revised: 06/23/2012 Document Reviewed: 07/08/2008   [employer] Patient Information 2014 [employer].

## 2013-07-10 LAB — CULTURE, BETA STREP (GROUP B ONLY)

## 2013-07-14 ENCOUNTER — Ambulatory Visit (INDEPENDENT_AMBULATORY_CARE_PROVIDER_SITE_OTHER): Payer: Medicaid Other | Admitting: Advanced Practice Midwife

## 2013-07-14 VITALS — BP 119/77 | Wt 227.5 lb

## 2013-07-14 DIAGNOSIS — O98319 Other infections with a predominantly sexual mode of transmission complicating pregnancy, unspecified trimester: Secondary | ICD-10-CM

## 2013-07-14 DIAGNOSIS — Z23 Encounter for immunization: Secondary | ICD-10-CM

## 2013-07-14 DIAGNOSIS — A749 Chlamydial infection, unspecified: Secondary | ICD-10-CM

## 2013-07-14 DIAGNOSIS — A568 Sexually transmitted chlamydial infection of other sites: Secondary | ICD-10-CM

## 2013-07-14 MED ORDER — INFLUENZA VIRUS VACC SPLIT PF IM SUSP
0.5000 mL | Freq: Once | INTRAMUSCULAR | Status: AC
  Administered 2013-07-14: 0.5 mL via INTRAMUSCULAR

## 2013-07-14 NOTE — Progress Notes (Signed)
Doing well.  Good fetal movement, denies vaginal bleeding, LOF, regular contractions.  Feeling intermittent pressure/cramping.  Desires cervical exam. GBS/ Chlamydia TOC negative last visit.

## 2013-07-14 NOTE — Progress Notes (Signed)
Pulse 111  

## 2013-07-21 ENCOUNTER — Ambulatory Visit (INDEPENDENT_AMBULATORY_CARE_PROVIDER_SITE_OTHER): Payer: Medicaid Other | Admitting: Family Medicine

## 2013-07-21 VITALS — BP 110/71 | Wt 229.0 lb

## 2013-07-21 DIAGNOSIS — O0933 Supervision of pregnancy with insufficient antenatal care, third trimester: Secondary | ICD-10-CM

## 2013-07-21 DIAGNOSIS — O0930 Supervision of pregnancy with insufficient antenatal care, unspecified trimester: Secondary | ICD-10-CM

## 2013-07-21 NOTE — Patient Instructions (Signed)

## 2013-07-21 NOTE — Progress Notes (Signed)
  Subjective:    Lynn Lynch is a 22 y.o. female being seen today for her obstetrical visit. She is at [redacted]w[redacted]d gestation. Patient reports no bleeding, no cramping, no leaking and occasional contractions. Fetal movement: normal.  Menstrual History: OB History   Grav Para Term Preterm Abortions TAB SAB Ect Mult Living   3 1 1  0 1 0 1 0 0 1       Patient's last menstrual period was 10/30/2012.    The following portions of the patient's history were reviewed and updated as appropriate: allergies, current medications, past family history, past medical history, past social history, past surgical history and problem list.  Review of Systems Pertinent items are noted in HPI.   Objective:    BP 110/71  Wt 229 lb (103.874 kg)  BMI 41.19 kg/m2  LMP 10/30/2012 FHT: 143 BPM  Uterine Size: 38 cm and size equals dates  Presentations:   Pelvic Exam: 2/50/-3                         Assessment:   Samauri Lynch is a 22 y.o. G3P1011 at [redacted]w[redacted]d here for ROB visit.    Discussed with Patient:  - Plans to breast/bottle feed.  All questions answered. - Continue prenatal vitamins. - Reviewed fetal kick counts Pt to perform daily at a time when the baby is active, lie laterally with both hands on belly in quiet room and count all movements (hiccups, shoulder rolls, obvious kicks, etc); pt is to report to clinic MAU for less than 10 movements felt in a 2 hour time period-pt told as soon as she counts 10 movements the count is complete.  - Routine precautions discussed (depression, infection s/s).   Patient provided with all pertinent phone numbers for emergencies. - RTC for any VB, regular, painful cramps/ctxs occurring at a rate of >2/10 min, fever (100.5 or higher), n/v/d, any pain that is unresolving or worsening, LOF, decreased fetal movement, CP, SOB, edema -RTC in one week for next visit.  Problems: Patient Active Problem List   Diagnosis Date Noted  . Late prenatal care 04/07/2013  .  Chlamydia infection, current pregnancy x 2 episodes 04/07/2013    To Do: 1.   [ ]  Vaccines: Flu: recd Tdap: recd [ ]  BCM: BTL [ ]  Readiness: baby has a place to sleep, car seat, other baby necessities.  Edu: [x ] PTL precautions; [ ]  BF class; [ ]  childbirth class; [ ]   BF counseling;

## 2013-07-21 NOTE — Progress Notes (Signed)
Pulse: 100

## 2013-07-28 ENCOUNTER — Inpatient Hospital Stay (HOSPITAL_COMMUNITY)
Admission: AD | Admit: 2013-07-28 | Discharge: 2013-07-28 | Disposition: A | Discharge status: Home / Self Care | Payer: Medicaid Other | Source: Ambulatory Visit | Attending: Obstetrics & Gynecology | Admitting: Obstetrics & Gynecology

## 2013-07-28 ENCOUNTER — Ambulatory Visit (INDEPENDENT_AMBULATORY_CARE_PROVIDER_SITE_OTHER): Payer: Medicaid Other | Admitting: Obstetrics & Gynecology

## 2013-07-28 VITALS — BP 143/90 | Temp 96.9°F | Wt 231.0 lb

## 2013-07-28 DIAGNOSIS — R030 Elevated blood-pressure reading, without diagnosis of hypertension: Secondary | ICD-10-CM | POA: Insufficient documentation

## 2013-07-28 DIAGNOSIS — A749 Chlamydial infection, unspecified: Secondary | ICD-10-CM

## 2013-07-28 DIAGNOSIS — O99891 Other specified diseases and conditions complicating pregnancy: Secondary | ICD-10-CM | POA: Insufficient documentation

## 2013-07-28 DIAGNOSIS — O0933 Supervision of pregnancy with insufficient antenatal care, third trimester: Secondary | ICD-10-CM

## 2013-07-28 DIAGNOSIS — IMO0002 Reserved for concepts with insufficient information to code with codable children: Secondary | ICD-10-CM

## 2013-07-28 DIAGNOSIS — O0930 Supervision of pregnancy with insufficient antenatal care, unspecified trimester: Secondary | ICD-10-CM

## 2013-07-28 DIAGNOSIS — O10019 Pre-existing essential hypertension complicating pregnancy, unspecified trimester: Secondary | ICD-10-CM

## 2013-07-28 LAB — COMPREHENSIVE METABOLIC PANEL
Alkaline Phosphatase: 106 U/L (ref 39–117)
BUN: 8 mg/dL (ref 6–23)
Calcium: 9.6 mg/dL (ref 8.4–10.5)
GFR calc Af Amer: >90 mL/min (ref >90)
Glucose, Bld: 81 mg/dL (ref 70–99)
Total Protein: 6.6 g/dL (ref 6.0–8.3)

## 2013-07-28 LAB — PROTEIN / CREATININE RATIO, URINE
Creatinine, Urine: 122.74 mg/dL
Protein Creatinine Ratio: 0.08 (ref 0.00–0.15)

## 2013-07-28 LAB — CBC WITH DIFFERENTIAL/PLATELET
Eosinophils Absolute: 0.1 10*3/uL (ref 0.0–0.7)
Eosinophils Relative: 2 % (ref 0–5)
Hemoglobin: 10.4 g/dL — ABNORMAL LOW (ref 12.0–15.0)
Lymphs Abs: 1.5 10*3/uL (ref 0.7–4.0)
MCH: 31.5 pg (ref 26.0–34.0)
MCV: 91.5 fL (ref 78.0–100.0)
Monocytes Relative: 9 % (ref 3–12)
RBC: 3.3 MIL/uL — ABNORMAL LOW (ref 3.87–5.11)

## 2013-07-28 LAB — POCT URINALYSIS DIP (DEVICE)
Glucose, UA: NEGATIVE mg/dL
Hgb urine dipstick: NEGATIVE
Nitrite: NEGATIVE
Urobilinogen, UA: 1 mg/dL (ref 0.0–1.0)
pH: 6 (ref 5.0–8.0)

## 2013-07-28 LAB — OB RESULTS CONSOLE GBS: GBS: NEGATIVE

## 2013-07-28 MED ORDER — ACETAMINOPHEN 500 MG PO TABS
1000.0000 mg | ORAL_TABLET | Freq: Once | ORAL | Status: AC
  Administered 2013-07-28: 1000 mg via ORAL
  Filled 2013-07-28: qty 2

## 2013-07-28 NOTE — H&P (Deleted)
Lynn Lynch is a 22 y.o. female presenting for r/o of pre-eclampsia. Maternal Medical History:  Reason for admission: Nausea.   Fetal activity: Perceived fetal activity is normal.   Last perceived fetal movement was within the past hour.    Prenatal complications: No bleeding.     OB History   Grav Para Term Preterm Abortions TAB SAB Ect Mult Living   3 1 1  0 1 0 1 0 0 1     Past Medical History  Diagnosis Date  . Gestational hypertension    Past Surgical History  Procedure Laterality Date  . No past surgeries     Family History: family history includes Hypertension in her mother. Social History:  reports that she quit smoking about 21 months ago. She has never used smokeless tobacco. She reports that she does not drink alcohol or use illicit drugs.   Prenatal Transfer Tool  Maternal Diabetes: No Genetic Screening: Normal Maternal Ultrasounds/Referrals: Normal Fetal Ultrasounds or other Referrals:  None Maternal Substance Abuse:  No Significant Maternal Medications:  None Significant Maternal Lab Results:  None Other Comments:  None  Review of Systems  Constitutional: Negative for fever.  HENT: Negative for sore throat.   Eyes: Negative for blurred vision.  Respiratory: Negative for cough, shortness of breath and wheezing.   Cardiovascular: Negative for chest pain and palpitations.  Gastrointestinal: Positive for nausea. Negative for heartburn, vomiting, diarrhea and constipation.  Genitourinary: Negative for dysuria, urgency and frequency.  Neurological: Positive for headaches. Negative for weakness.      Blood pressure 100/61, pulse 82, temperature 98.2 F (36.8 C), temperature source Oral, resp. rate 18, height 5\' 2"  (1.575 m), weight 104.781 kg (231 lb), last menstrual period 10/30/2012.   Fetal Exam Fetal Monitor Review: Mode: fetoscope.   Baseline rate: 140.  Variability: moderate (6-25 bpm).   Pattern: accelerations present and no decelerations.     Fetal State Assessment: Category I - tracings are normal.     Physical Exam  Constitutional: She is oriented to person, place, and time. She appears well-developed and well-nourished. No distress.  HENT:  Head: Normocephalic and atraumatic.  Cardiovascular: Normal rate, regular rhythm, normal heart sounds and intact distal pulses.   Respiratory: Effort normal and breath sounds normal.  GI: Soft. Bowel sounds are normal.  Neurological: She is alert and oriented to person, place, and time.  Skin: Skin is warm and dry. No rash noted. She is not diaphoretic. No erythema. No pallor.  Psychiatric: She has a normal mood and affect.    Prenatal labs: ABO, Rh: A/POS/-- (04/24 1036) Antibody: NEG (04/24 1036) Rubella: 12.70 (04/24 1036) RPR: NON REAC (09/18 1241)  HBsAg: NEGATIVE (04/24 1036)  HIV: NON REACTIVE (09/18 1241)  GBS:   Negative  Assessment/Plan:  Patient presents for an elevated blood pressure. She was admitted to the birthing suites for r/o of pre-eclampsia. Patient denies  Labs have been ordered: LFTs, Protein / Cr ratio.  Once labs return will decide whether to admit or discharge patient.  Bing Plume 07/28/2013, 1:53 PM

## 2013-07-28 NOTE — Patient Instructions (Signed)
Preeclampsia and Eclampsia  Preeclampsia is a condition of high blood pressure during pregnancy. It can happen at 20 weeks or later in pregnancy. If high blood pressure occurs in the second half of pregnancy with no other symptoms, it is called gestational hypertension and goes away after the baby is born. If any of the symptoms listed below develop with gestational hypertension, it is then called preeclampsia. Eclampsia (convulsions) may follow preeclampsia. This is one of the reasons for regular prenatal checkups. Early diagnosis and treatment are very important to prevent eclampsia.  CAUSES   There is no known cause of preeclampsia/eclampsia in pregnancy. There are several known conditions that may put the pregnant woman at risk, such as:  · The first pregnancy.  · Having preeclampsia in a past pregnancy.  · Having lasting (chronic) high blood pressure.  · Having multiples (twins, triplets).  · Being age 35 or older.  · African American ethnic background.  · Having kidney disease or diabetes.  · Medical conditions such as lupus or blood diseases.  · Being overweight (obese).  SYMPTOMS   · High blood pressure.  · Headaches.  · Sudden weight gain.  · Swelling of hands, face, legs, and feet.  · Protein in the urine.  · Feeling sick to your stomach (nauseous) and throwing up (vomiting).  · Vision problems (blurred or double vision).  · Numbness in the face, arms, legs, and feet.  · Dizziness.  · Slurred speech.  · Preeclampsia can cause growth retardation in the fetus.  · Separation (abruption) of the placenta.  · Not enough fluid in the amniotic sac (oligohydramnios).  · Sensitivity to bright lights.  · Belly (abdominal) pain.  DIAGNOSIS   If protein is found in the urine in the second half of pregnancy, this is considered preeclampsia. Other symptoms mentioned above may also be present.  TREATMENT   It is necessary to treat this.  · Your caregiver may prescribe bed rest early in this condition. Plenty of rest and  salt restriction may be all that is needed.  · Medicines may be necessary to lower blood pressure if the condition does not respond to more conservative measures.  · In more severe cases, hospitalization may be needed:  · For treatment of blood pressure.  · To control fluid retention.  · To monitor the baby to see if the condition is causing harm to the baby.  · Hospitalization is the best way to treat the first sign of preeclampsia. This is so the mother and baby can be watched closely and blood tests can be done effectively and correctly.  · If the condition becomes severe, it may be necessary to induce labor or to remove the infant by surgical means (cesarean section). The best cure for preeclampsia/eclampsia is to deliver the baby.  Preeclampsia and eclampsia involve risks to mother and infant. Your caregiver will discuss these risks with you. Together, you can work out the best possible approach to your problems. Make sure you keep your prenatal visits as scheduled. Not keeping appointments could result in a chronic or permanent injury, pain, disability to you, and death or injury to you or your unborn baby. If there is any problem keeping the appointment, you must call to reschedule.  HOME CARE INSTRUCTIONS   · Keep your prenatal appointments and tests as scheduled.  · Tell your caregiver if you have any of the above risk factors.  · Get plenty of rest and sleep.  · Eat a balanced   diet that is low in salt, and do not add salt to your food.  · Avoid stressful situations.  · Only take over-the-counter and prescriptions medicines for pain, discomfort, or fever as directed by your caregiver.  SEEK IMMEDIATE MEDICAL CARE IF:   · You develop severe swelling anywhere in the body. This usually occurs in the legs.  · You gain 5 lb/2.3 kg or more in a week.  · You develop a severe headache, dizziness, problems with your vision, or confusion.  · You have abdominal pain, nausea, or vomiting.  · You have a seizure.  · You  have trouble moving any part of your body, or you develop numbness or problems speaking.  · You have bruising or abnormal bleeding from anywhere in the body.  · You develop a stiff neck.  · You pass out.  MAKE SURE YOU:   · Understand these instructions.  · Will watch your condition.  · Will get help right away if you are not doing well or get worse.  Document Released: 09/26/2000 Document Revised: 12/22/2011 Document Reviewed: 05/12/2008  [employer]® Patient Information ©2014 [employer].

## 2013-07-28 NOTE — Progress Notes (Signed)
Pulse 94    2nd BP: 131/79 Edema trace in hands and feet.

## 2013-07-28 NOTE — Progress Notes (Signed)
To MAU evaluate for preeclampsia. C/O headache

## 2013-07-28 NOTE — MAU Provider Note (Signed)
Lynn Lynch is a 22 y.o. female presenting for r/o of pre-eclampsia. Maternal Medical History:   Reason for admission: Nausea.    Fetal activity: Perceived fetal activity is normal.   Last perceived fetal movement was within the past hour.     Prenatal complications: No bleeding.    OB History     Grav  Para  Term  Preterm  Abortions  TAB  SAB  Ect  Mult  Living     3  1  1   0  1  0  1  0  0  1       Past Medical History   Diagnosis  Date   .  Gestational hypertension      Past Surgical History   Procedure  Laterality  Date   .  No past surgeries        Family History: family history includes Hypertension in her mother. Social History: reports that she quit smoking about 21 months ago. She has never used smokeless tobacco. She reports that she does not drink alcohol or use illicit drugs.      Prenatal Transfer Tool    Maternal Diabetes: No Genetic Screening: Normal Maternal Ultrasounds/Referrals: Normal Fetal Ultrasounds or other Referrals:  None Maternal Substance Abuse:  No Significant Maternal Medications:  None Significant Maternal Lab Results:  None Other Comments:  None   Review of Systems  Constitutional: Negative for fever.  HENT: Negative for sore throat.   Eyes: Negative for blurred vision.  Respiratory: Negative for cough, shortness of breath and wheezing.   Cardiovascular: Negative for chest pain and palpitations.  Gastrointestinal: Positive for nausea. Negative for heartburn, vomiting, diarrhea and constipation.  Genitourinary: Negative for dysuria, urgency and frequency.  Neurological: Positive for headaches. Negative for weakness.    Blood pressure 100/61, pulse 82, temperature 98.2 F (36.8 C), temperature source Oral, resp. rate 18, height 5\' 2"  (1.575 m), weight 104.781 kg (231 lb), last menstrual period 10/30/2012.   Fetal Exam Fetal Monitor Review: Mode: fetoscope.   Baseline rate: 140.  Variability: moderate (6-25 bpm).   Pattern:  accelerations present and no decelerations.     Fetal State Assessment: Category I - tracings are normal.     Physical Exam  Constitutional: She is oriented to person, place, and time. She appears well-developed and well-nourished. No distress.  HENT:   Head: Normocephalic and atraumatic.  Cardiovascular: Normal rate, regular rhythm, normal heart sounds and intact distal pulses.   Respiratory: Effort normal and breath sounds normal.  GI: Soft. Bowel sounds are normal.  Neurological: She is alert and oriented to person, place, and time.  Skin: Skin is warm and dry. No rash noted. She is not diaphoretic. No erythema. No pallor.  Psychiatric: She has a normal mood and affect.  Prenatal labs: ABO, Rh: A/POS/-- (04/24 1036) Antibody: NEG (04/24 1036) Rubella: 12.70 (04/24 1036) RPR: NON REAC (09/18 1241)   HBsAg: NEGATIVE (04/24 1036)   HIV: NON REACTIVE (09/18 1241)   GBS: Negative   Assessment/Plan:   Patient presents for an elevated blood pressure. She was admitted to the birthing suites for r/o of pre-eclampsia. Patient denies   Labs have been ordered: LFTs, Protein / Cr ratio.   Once labs return will decide whether to admit or discharge patient.   Bing Plume 07/28/2013, 1:53 PM  CBC    Component Value Date/Time   WBC 6.0 07/28/2013 1440   RBC 3.30* 07/28/2013 1440   HGB 10.4* 07/28/2013 1440  HCT 30.2* 07/28/2013 1440   PLT 230 07/28/2013 1440   MCV 91.5 07/28/2013 1440   MCH 31.5 07/28/2013 1440   MCHC 34.4 07/28/2013 1440   RDW 13.7 07/28/2013 1440   LYMPHSABS 1.5 07/28/2013 1440   MONOABS 0.5 07/28/2013 1440   EOSABS 0.1 07/28/2013 1440   BASOSABS 0.0 07/28/2013 1440    CMP     Component Value Date/Time   NA 135 07/28/2013 1440   K 4.2 07/28/2013 1440   CL 103 07/28/2013 1440   CO2 23 07/28/2013 1440   GLUCOSE 81 07/28/2013 1440   BUN 8 07/28/2013 1440   CREATININE 0.56 07/28/2013 1440   CALCIUM 9.6 07/28/2013 1440   PROT 6.6  07/28/2013 1440   ALBUMIN 2.8* 07/28/2013 1440   AST 17 07/28/2013 1440   ALT 9 07/28/2013 1440   ALKPHOS 106 07/28/2013 1440   BILITOT 0.2* 07/28/2013 1440   GFRNONAA >90 07/28/2013 1440   GFRAA >90 07/28/2013 1440    Labs WNL, PR;CR pending with no pro on urine dip in clinic. BPs all WNL here. Will discharge to follow up as previously scheduled on Monday with high risk clinic. Preeclampsia caugtion and Labor precautions givings.   I spoke with and examined patient and agree with PA-S's note and plan of care.  Tawana Scale, MD Ob Fellow 07/28/2013 4:37 PM

## 2013-07-30 LAB — CULTURE, OB URINE

## 2013-08-01 ENCOUNTER — Encounter (HOSPITAL_COMMUNITY): Payer: Medicaid Other | Admitting: Anesthesiology

## 2013-08-01 ENCOUNTER — Inpatient Hospital Stay (HOSPITAL_COMMUNITY): Payer: Medicaid Other | Admitting: Anesthesiology

## 2013-08-01 ENCOUNTER — Encounter (HOSPITAL_COMMUNITY): Payer: Self-pay | Admitting: *Deleted

## 2013-08-01 ENCOUNTER — Inpatient Hospital Stay (HOSPITAL_COMMUNITY)
Admission: AD | Admit: 2013-08-01 | Discharge: 2013-08-04 | DRG: 767 | Disposition: A | Discharge status: Home / Self Care | Payer: Medicaid Other | Source: Ambulatory Visit | Attending: Obstetrics & Gynecology | Admitting: Obstetrics & Gynecology

## 2013-08-01 DIAGNOSIS — Z302 Encounter for sterilization: Secondary | ICD-10-CM

## 2013-08-01 LAB — CBC
HCT: 30.6 % — ABNORMAL LOW (ref 36.0–46.0)
MCH: 32 pg (ref 26.0–34.0)
MCHC: 34.3 g/dL (ref 30.0–36.0)
MCV: 93.3 fL (ref 78.0–100.0)
Platelets: 237 10*3/uL (ref 150–400)
RDW: 13.3 % (ref 11.5–15.5)

## 2013-08-01 MED ORDER — LACTATED RINGERS IV SOLN
500.0000 mL | Freq: Once | INTRAVENOUS | Status: AC
  Administered 2013-08-01: 500 mL via INTRAVENOUS

## 2013-08-01 MED ORDER — FENTANYL 2.5 MCG/ML BUPIVACAINE 1/10 % EPIDURAL INFUSION (WH - ANES)
INTRAMUSCULAR | Status: DC | PRN
  Administered 2013-08-01: 13 mL/h via EPIDURAL

## 2013-08-01 MED ORDER — LACTATED RINGERS IV SOLN
INTRAVENOUS | Status: DC
  Administered 2013-08-01: 23:00:00 via INTRAVENOUS
  Administered 2013-08-01: 125 mL/h via INTRAVENOUS
  Administered 2013-08-02: 10:00:00 via INTRAVENOUS

## 2013-08-01 MED ORDER — FLEET ENEMA 7-19 GM/118ML RE ENEM: 1.0000 | ENEMA | RECTAL | Status: DC | PRN

## 2013-08-01 MED ORDER — ONDANSETRON 4 MG PO TBDP
4.0000 mg | ORAL_TABLET | Freq: Once | ORAL | Status: AC
  Administered 2013-08-01: 4 mg via ORAL
  Filled 2013-08-01: qty 1

## 2013-08-01 MED ORDER — ONDANSETRON HCL 4 MG/2ML IJ SOLN
4.0000 mg | Freq: Four times a day (QID) | INTRAMUSCULAR | Status: DC | PRN
  Administered 2013-08-02: 4 mg via INTRAVENOUS
  Filled 2013-08-01: qty 2

## 2013-08-01 MED ORDER — LIDOCAINE HCL (PF) 1 % IJ SOLN
30.0000 mL | INTRAMUSCULAR | Status: DC | PRN
  Filled 2013-08-01: qty 30

## 2013-08-01 MED ORDER — OXYCODONE-ACETAMINOPHEN 5-325 MG PO TABS
1.0000 | ORAL_TABLET | Freq: Once | ORAL | Status: AC
  Administered 2013-08-01: 1 via ORAL
  Filled 2013-08-01: qty 1

## 2013-08-01 MED ORDER — LACTATED RINGERS IV SOLN: 500.0000 mL | INTRAVENOUS | Status: DC | PRN

## 2013-08-01 MED ORDER — DIPHENHYDRAMINE HCL 50 MG/ML IJ SOLN: 12.5000 mg | INTRAMUSCULAR | Status: DC | PRN

## 2013-08-01 MED ORDER — EPHEDRINE 5 MG/ML INJ
10.0000 mg | INTRAVENOUS | Status: DC | PRN
  Filled 2013-08-01: qty 4

## 2013-08-01 MED ORDER — FENTANYL 2.5 MCG/ML BUPIVACAINE 1/10 % EPIDURAL INFUSION (WH - ANES)
14.0000 mL/h | INTRAMUSCULAR | Status: DC | PRN
  Filled 2013-08-01: qty 125

## 2013-08-01 MED ORDER — EPHEDRINE 5 MG/ML INJ: 10.0000 mg | INTRAVENOUS | Status: DC | PRN

## 2013-08-01 MED ORDER — ACETAMINOPHEN 325 MG PO TABS: 650.0000 mg | ORAL_TABLET | ORAL | Status: DC | PRN

## 2013-08-01 MED ORDER — CITRIC ACID-SODIUM CITRATE 334-500 MG/5ML PO SOLN: 30.0000 mL | ORAL | Status: DC | PRN

## 2013-08-01 MED ORDER — OXYCODONE-ACETAMINOPHEN 5-325 MG PO TABS: 1.0000 | ORAL_TABLET | ORAL | Status: DC | PRN

## 2013-08-01 MED ORDER — PHENYLEPHRINE 40 MCG/ML (10ML) SYRINGE FOR IV PUSH (FOR BLOOD PRESSURE SUPPORT)
80.0000 ug | PREFILLED_SYRINGE | INTRAVENOUS | Status: DC | PRN
  Filled 2013-08-01: qty 5

## 2013-08-01 MED ORDER — FENTANYL CITRATE 0.05 MG/ML IJ SOLN
100.0000 ug | INTRAMUSCULAR | Status: DC | PRN
  Administered 2013-08-01: 100 ug via INTRAVENOUS
  Filled 2013-08-01: qty 2

## 2013-08-01 MED ORDER — IBUPROFEN 600 MG PO TABS: 600.0000 mg | ORAL_TABLET | Freq: Four times a day (QID) | ORAL | Status: DC | PRN

## 2013-08-01 MED ORDER — OXYTOCIN 40 UNITS IN LACTATED RINGERS INFUSION - SIMPLE MED
62.5000 mL/h | INTRAVENOUS | Status: DC
  Filled 2013-08-01: qty 1000

## 2013-08-01 MED ORDER — OXYTOCIN BOLUS FROM INFUSION
500.0000 mL | INTRAVENOUS | Status: DC
  Administered 2013-08-02: 500 mL via INTRAVENOUS

## 2013-08-01 MED ORDER — PHENYLEPHRINE 40 MCG/ML (10ML) SYRINGE FOR IV PUSH (FOR BLOOD PRESSURE SUPPORT): 80.0000 ug | PREFILLED_SYRINGE | INTRAVENOUS | Status: DC | PRN

## 2013-08-01 MED ORDER — LIDOCAINE HCL (PF) 1 % IJ SOLN
INTRAMUSCULAR | Status: DC | PRN
  Administered 2013-08-01 (×2): 3 mL

## 2013-08-01 NOTE — Anesthesia Procedure Notes (Signed)
Epidural Patient location during procedure: OB Start time: 08/01/2013 11:28 PM  Staffing Anesthesiologist: Deannie Resetar A. Performed by: anesthesiologist   Preanesthetic Checklist Completed: patient identified, site marked, surgical consent, pre-op evaluation, timeout performed, IV checked, risks and benefits discussed and monitors and equipment checked  Epidural Patient position: sitting Prep: site prepped and draped and DuraPrep Patient monitoring: continuous pulse ox and blood pressure Approach: midline Injection technique: LOR air  Needle:  Needle type: Tuohy  Needle gauge: 17 G Needle length: 9 cm and 9 Needle insertion depth: 6 cm Catheter type: closed end flexible Catheter size: 19 Gauge Catheter at skin depth: 11 cm Test dose: negative and Other  Assessment Events: blood not aspirated, injection not painful, no injection resistance, negative IV test and no paresthesia  Additional Notes Patient identified. Risks and benefits discussed including failed block, incomplete  Pain control, post dural puncture headache, nerve damage, paralysis, blood pressure Changes, nausea, vomiting, reactions to medications-both toxic and allergic and post Partum back pain. All questions were answered. Patient expressed understanding and wished to proceed. Sterile technique was used throughout procedure. Epidural site was Dressed with sterile barrier dressing. No paresthesias, signs of intravascular injection Or signs of intrathecal spread were encountered.  Patient was more comfortable after the epidural was dosed. Please see RN's note for documentation of vital signs and FHR which are stable.

## 2013-08-01 NOTE — Progress Notes (Signed)
Dr Michail Jewels notified of patient, tracing, ctx pattern, sve result and patient wanting something for pain. Dr Michail Jewels states that she will recheck cervix in an hour.

## 2013-08-01 NOTE — MAU Note (Signed)
Patient is brought in by ems with c/o ctx q10mins all day. She states that she had a mucus discharge. She denies bleeding or lof. She reports  Good fetal movement.

## 2013-08-01 NOTE — H&P (Signed)
Lynn Lynch is a 22 y.o. female presenting for passage of mucous plug at approx 8pm this evening. Has felt contractions starting this am upon wakening, happening every 20 mins, increasing to every 5 minutes, lasting approx each. No vaginal bleeding/discharge. Good fetal movement. No recent illnesses.  PNC at [facility] since 4 months. Had nml glucose screen, too late for genetic scrren. Nml [place]. Treated for chlamydia at 23 wks. GBS neg, plan to breast feed. Plan for BTL for contraception (papers signed). Last prenatal visit had 1 elevated BP reading. Prev gestational HTN but not with this pregnancy.  Maternal Medical History:  Reason for admission: Nausea.    OB History   Grav Para Term Preterm Abortions TAB SAB Ect Mult Living   3 1 1  0 1 0 1 0 0 1     1. Term, 40w0, 8lb1oz, SVD 2. SAB 3. Current  Past Medical History  Diagnosis Date  . Gestational hypertension    Past Surgical History  Procedure Laterality Date  . No past surgeries     Family History: family history includes Hypertension in her mother. Social History:  reports that she quit smoking about 21 months ago. She has never used smokeless tobacco. She reports that she does not drink alcohol or use illicit drugs.   Prenatal Transfer Tool  Maternal Diabetes: No Genetic Screening: Declined, too late Maternal Ultrasounds/Referrals: Normal Fetal Ultrasounds or other Referrals:  None Maternal Substance Abuse:  No Significant Maternal Medications:  None Significant Maternal Lab Results:  None Other Comments:  None  Review of Systems  Constitutional: Negative for fever and chills.  HENT: Negative for congestion and sore throat.   Eyes: Negative for blurred vision, double vision and pain.  Respiratory: Negative for cough, shortness of breath and wheezing.   Cardiovascular: Negative for chest pain, palpitations and leg swelling.  Gastrointestinal: Negative for heartburn, nausea and vomiting.  Genitourinary:  Negative for dysuria, urgency and frequency.  Musculoskeletal: Negative for myalgias.  Skin: Negative for rash.  Neurological: Negative for dizziness, seizures, loss of consciousness, weakness and headaches.  Endo/Heme/Allergies: Does not bruise/bleed easily.  Psychiatric/Behavioral: Negative for depression.    Dilation: 5 Effacement (%): 70;80 Station: -2 Exam by:: D Simpson RN Blood pressure 127/81, pulse 95, temperature 98.1 F (36.7 C), temperature source Oral, resp. rate 18, last menstrual period 10/30/2012, SpO2 100.00%. Exam Physical Exam  Constitutional: She is oriented to person, place, and time. She appears well-developed and well-nourished. No distress (in discomfort).  HENT:  Head: Normocephalic and atraumatic.  Eyes: EOM are normal.  Neck: Normal range of motion. Neck supple.  Cardiovascular: Normal rate, regular rhythm and normal heart sounds.   Respiratory: Effort normal and breath sounds normal. No respiratory distress.  GI: Soft. Bowel sounds are normal. She exhibits distension (gravid). There is tenderness (appropriate with contractions).  Musculoskeletal: Normal range of motion. She exhibits no edema.  Neurological: She is alert and oriented to person, place, and time.  Skin: Skin is warm and dry. No erythema.  Psychiatric: She has a normal mood and affect. Her behavior is normal.    Prenatal labs: ABO, Rh: A/POS/-- (04/24 1036) Antibody: NEG (04/24 1036) Rubella: 12.70 (04/24 1036) RPR: NON REAC (09/18 1241)  HBsAg: NEGATIVE (04/24 1036)  HIV: NON REACTIVE (09/18 1241)  GBS: Negative (10/16 1400)   FHR- baseline 140s, mod variability, post accels, no decels- period where fhr not captured 2/2 patient movement, reassuring tracing following this period  Assessment/Plan: Lynn Lynch is a 21y.o [id number] who  presents at 39w2 in active labor after passage of mucus plug this evening  1. Active labor at term- made cervical change since being in the  MAU -routine orders -fentanyl and epidural for pain control prn -GBS neg, HIV NR -expect NSVD  2. Hx of gestational HTN with prior pregnancy, had one elevated BP reading at last prenatal visit, BPs normotensive here -serial BPs, monitor for signs of pre-e -no labs indicated at this time  3. FHR- reassuring -cat I  4. Postpartum- plans for breast feeding, BTL for contraception -papers signed 09/18 -f/up at [facility] postpartum 4-6weeks  [name] 08/01/2013, 9:52 PM    I have seen and examined this patient and agree with above documentation in the resident's note.   Rulon Abide, M.D. [facility] Fellow 08/01/2013 11:29 PM

## 2013-08-01 NOTE — Progress Notes (Signed)
Dr Mindi Junker notified that patient c/o nausea and rectal pressure.

## 2013-08-01 NOTE — Anesthesia Preprocedure Evaluation (Signed)
Anesthesia Evaluation  Patient identified by MRN, date of birth, ID band Patient awake    Reviewed: Allergy & Precautions, H&P , Patient's Chart, lab work & pertinent test results  Airway Mallampati: III TM Distance: >3 FB Neck ROM: Full    Dental no notable dental hx. (+) Teeth Intact   Pulmonary neg pulmonary ROS,  breath sounds clear to auscultation  Pulmonary exam normal       Cardiovascular hypertension, negative cardio ROS  Rhythm:Regular Rate:Normal     Neuro/Psych negative neurological ROS  negative psych ROS   GI/Hepatic negative GI ROS, Neg liver ROS,   Endo/Other  Morbid obesity  Renal/GU negative Renal ROS  negative genitourinary   Musculoskeletal negative musculoskeletal ROS (+)   Abdominal (+) + obese,   Peds  Hematology negative hematology ROS (+)   Anesthesia Other Findings   Reproductive/Obstetrics (+) Pregnancy                           Anesthesia Physical Anesthesia Plan  ASA: III  Anesthesia Plan: Epidural   Post-op Pain Management:    Induction:   Airway Management Planned: Natural Airway  Additional Equipment:   Intra-op Plan:   Post-operative Plan:   Informed Consent: I have reviewed the patients History and Physical, chart, labs and discussed the procedure including the risks, benefits and alternatives for the proposed anesthesia with the patient or authorized representative who has indicated his/her understanding and acceptance.     Plan Discussed with: Anesthesiologist  Anesthesia Plan Comments:         Anesthesia Quick Evaluation

## 2013-08-02 ENCOUNTER — Encounter (HOSPITAL_COMMUNITY): Payer: Medicaid Other | Admitting: Anesthesiology

## 2013-08-02 ENCOUNTER — Encounter (HOSPITAL_COMMUNITY): Payer: Self-pay | Admitting: *Deleted

## 2013-08-02 ENCOUNTER — Inpatient Hospital Stay (HOSPITAL_COMMUNITY): Payer: Medicaid Other | Admitting: Anesthesiology

## 2013-08-02 ENCOUNTER — Encounter (HOSPITAL_COMMUNITY)
Admission: AD | Disposition: A | Discharge status: Home / Self Care | Payer: Self-pay | Source: Ambulatory Visit | Attending: Obstetrics & Gynecology

## 2013-08-02 DIAGNOSIS — O0940 Supervision of pregnancy with grand multiparity, unspecified trimester: Secondary | ICD-10-CM

## 2013-08-02 DIAGNOSIS — Z302 Encounter for sterilization: Secondary | ICD-10-CM

## 2013-08-02 HISTORY — PX: TUBAL LIGATION: SHX77

## 2013-08-02 LAB — CBC
Hemoglobin: 9.7 g/dL — ABNORMAL LOW (ref 12.0–15.0)
MCH: 32.4 pg (ref 26.0–34.0)
MCV: 92 fL (ref 78.0–100.0)
Platelets: 184 10*3/uL (ref 150–400)
RBC: 2.99 MIL/uL — ABNORMAL LOW (ref 3.87–5.11)
RDW: 13.9 % (ref 11.5–15.5)
WBC: 10.2 10*3/uL (ref 4.0–10.5)

## 2013-08-02 LAB — TYPE AND SCREEN: ABO/RH(D): A POS

## 2013-08-02 SURGERY — LIGATION, FALLOPIAN TUBE, POSTPARTUM
Anesthesia: Epidural | Site: Abdomen | Laterality: Bilateral | Wound class: Clean

## 2013-08-02 MED ORDER — SENNOSIDES-DOCUSATE SODIUM 8.6-50 MG PO TABS
2.0000 | ORAL_TABLET | ORAL | Status: DC
  Administered 2013-08-02 – 2013-08-03 (×2): 2 via ORAL
  Filled 2013-08-02 (×2): qty 2

## 2013-08-02 MED ORDER — BUPIVACAINE HCL 0.5 % IJ SOLN
INTRAMUSCULAR | Status: DC | PRN
  Administered 2013-08-02: 30 mL

## 2013-08-02 MED ORDER — ZOLPIDEM TARTRATE 5 MG PO TABS: 5.0000 mg | ORAL_TABLET | Freq: Every evening | ORAL | Status: DC | PRN

## 2013-08-02 MED ORDER — BENZOCAINE-MENTHOL 20-0.5 % EX AERO
1.0000 "application " | INHALATION_SPRAY | CUTANEOUS | Status: DC | PRN
  Administered 2013-08-03: 1 via TOPICAL
  Filled 2013-08-02: qty 56

## 2013-08-02 MED ORDER — METOCLOPRAMIDE HCL 5 MG/ML IJ SOLN: 10.0000 mg | Freq: Once | INTRAMUSCULAR | Status: DC | PRN

## 2013-08-02 MED ORDER — LACTATED RINGERS IV SOLN
INTRAVENOUS | Status: DC | PRN
  Administered 2013-08-02 (×2): via INTRAVENOUS

## 2013-08-02 MED ORDER — IBUPROFEN 600 MG PO TABS
600.0000 mg | ORAL_TABLET | Freq: Four times a day (QID) | ORAL | Status: DC
Start: 2013-08-02 — End: 2013-08-04
  Administered 2013-08-02 – 2013-08-04 (×8): 600 mg via ORAL
  Filled 2013-08-02 (×9): qty 1

## 2013-08-02 MED ORDER — LIDOCAINE-EPINEPHRINE (PF) 2 %-1:200000 IJ SOLN
INTRAMUSCULAR | Status: AC
  Filled 2013-08-02: qty 20

## 2013-08-02 MED ORDER — LANOLIN HYDROUS EX OINT: TOPICAL_OINTMENT | CUTANEOUS | Status: DC | PRN

## 2013-08-02 MED ORDER — WITCH HAZEL-GLYCERIN EX PADS: 1.0000 "application " | MEDICATED_PAD | CUTANEOUS | Status: DC | PRN

## 2013-08-02 MED ORDER — PRENATAL MULTIVITAMIN CH
1.0000 | ORAL_TABLET | Freq: Every day | ORAL | Status: DC
  Administered 2013-08-03 – 2013-08-04 (×2): 1 via ORAL
  Filled 2013-08-02 (×2): qty 1

## 2013-08-02 MED ORDER — CITRIC ACID-SODIUM CITRATE 334-500 MG/5ML PO SOLN
30.0000 mL | Freq: Once | ORAL | Status: AC
  Administered 2013-08-02: 30 mL via ORAL
  Filled 2013-08-02: qty 15

## 2013-08-02 MED ORDER — FENTANYL CITRATE 0.05 MG/ML IJ SOLN
25.0000 ug | INTRAMUSCULAR | Status: DC | PRN
  Administered 2013-08-02 (×2): 50 ug via INTRAVENOUS

## 2013-08-02 MED ORDER — KETOROLAC TROMETHAMINE 30 MG/ML IJ SOLN: 15.0000 mg | Freq: Once | INTRAMUSCULAR | Status: DC | PRN

## 2013-08-02 MED ORDER — MIDAZOLAM HCL 2 MG/2ML IJ SOLN
INTRAMUSCULAR | Status: AC
  Filled 2013-08-02: qty 2

## 2013-08-02 MED ORDER — SIMETHICONE 80 MG PO CHEW: 80.0000 mg | CHEWABLE_TABLET | ORAL | Status: DC | PRN

## 2013-08-02 MED ORDER — ONDANSETRON HCL 4 MG/2ML IJ SOLN
4.0000 mg | INTRAMUSCULAR | Status: DC | PRN
Start: 2013-08-02 — End: 2013-08-04

## 2013-08-02 MED ORDER — FAMOTIDINE 20 MG PO TABS
20.0000 mg | ORAL_TABLET | Freq: Once | ORAL | Status: AC
  Administered 2013-08-02: 20 mg via ORAL
  Filled 2013-08-02: qty 1

## 2013-08-02 MED ORDER — OXYCODONE-ACETAMINOPHEN 5-325 MG PO TABS
1.0000 | ORAL_TABLET | ORAL | Status: DC | PRN
  Administered 2013-08-02: 1 via ORAL
  Administered 2013-08-02: 2 via ORAL
  Administered 2013-08-03 (×2): 1 via ORAL
  Administered 2013-08-03 – 2013-08-04 (×2): 2 via ORAL
  Filled 2013-08-02: qty 1
  Filled 2013-08-02 (×3): qty 2
  Filled 2013-08-02 (×2): qty 1

## 2013-08-02 MED ORDER — DIBUCAINE 1 % RE OINT: 1.0000 "application " | TOPICAL_OINTMENT | RECTAL | Status: DC | PRN

## 2013-08-02 MED ORDER — ONDANSETRON HCL 4 MG PO TABS: 4.0000 mg | ORAL_TABLET | ORAL | Status: DC | PRN

## 2013-08-02 MED ORDER — SODIUM BICARBONATE 8.4 % IV SOLN
INTRAVENOUS | Status: DC | PRN
  Administered 2013-08-02 (×3): 5 mL via EPIDURAL

## 2013-08-02 MED ORDER — MEPERIDINE HCL 25 MG/ML IJ SOLN: 6.2500 mg | INTRAMUSCULAR | Status: DC | PRN

## 2013-08-02 MED ORDER — FENTANYL CITRATE 0.05 MG/ML IJ SOLN
INTRAMUSCULAR | Status: AC
  Administered 2013-08-02: 50 ug via INTRAVENOUS
  Filled 2013-08-02: qty 2

## 2013-08-02 MED ORDER — MIDAZOLAM HCL 2 MG/2ML IJ SOLN
INTRAMUSCULAR | Status: DC | PRN
  Administered 2013-08-02 (×2): 1 mg via INTRAVENOUS

## 2013-08-02 MED ORDER — DIPHENHYDRAMINE HCL 25 MG PO CAPS: 25.0000 mg | ORAL_CAPSULE | Freq: Four times a day (QID) | ORAL | Status: DC | PRN

## 2013-08-02 MED ORDER — BUPIVACAINE HCL (PF) 0.5 % IJ SOLN
INTRAMUSCULAR | Status: AC
  Filled 2013-08-02: qty 30

## 2013-08-02 SURGICAL SUPPLY — 20 items
BENZOIN TINCTURE PRP APPL 2/3 (GAUZE/BANDAGES/DRESSINGS) ×2 IMPLANT
CATH ROBINSON RED A/P 16FR (CATHETERS) IMPLANT
CHLORAPREP W/TINT 26ML (MISCELLANEOUS) ×2 IMPLANT
CLIP FILSHIE TUBAL LIGA STRL (Clip) ×2 IMPLANT
CLOTH BEACON ORANGE TIMEOUT ST (SAFETY) ×2 IMPLANT
DRSG COVADERM PLUS 2X2 (GAUZE/BANDAGES/DRESSINGS) ×2 IMPLANT
GLOVE ECLIPSE 7.0 STRL STRAW (GLOVE) ×2 IMPLANT
GLOVE INDICATOR 7.0 STRL GRN (GLOVE) ×4 IMPLANT
GOWN PREVENTION PLUS LG XLONG (DISPOSABLE) ×2 IMPLANT
GOWN STRL REIN XL XLG (GOWN DISPOSABLE) ×2 IMPLANT
NEEDLE HYPO 25X1 1.5 SAFETY (NEEDLE) ×2 IMPLANT
NS IRRIG 1000ML POUR BTL (IV SOLUTION) ×2 IMPLANT
PACK ABDOMINAL MINOR (CUSTOM PROCEDURE TRAY) ×2 IMPLANT
STRIP CLOSURE SKIN 1/4X3 (GAUZE/BANDAGES/DRESSINGS) ×2 IMPLANT
SUT VIC AB 0 CT1 27 (SUTURE) ×1
SUT VIC AB 0 CT1 27XBRD ANBCTR (SUTURE) ×1 IMPLANT
SUT VIC AB 4-0 PS2 27 (SUTURE) ×2 IMPLANT
SYR CONTROL 10ML LL (SYRINGE) ×2 IMPLANT
TOWEL OR 17X24 6PK STRL BLUE (TOWEL DISPOSABLE) ×4 IMPLANT
WATER STERILE IRR 1000ML POUR (IV SOLUTION) IMPLANT

## 2013-08-02 NOTE — Progress Notes (Signed)
Lynn Lynch is a 22 y.o. G3P1011 at [redacted]w[redacted]d admitted for active labor  Subjective: Resting quietly in bed, with friends at bedside, feels quite comfortable. No complaints   Objective: BP 121/73  Pulse 95  Temp(Src) 98.1 F (36.7 C) (Oral)  Resp 18  Ht 5\' 3"  (1.6 m)  Wt 107.049 kg (236 lb)  BMI 41.82 kg/m2  SpO2 100%  LMP 10/30/2012   Total I/O In: -  Out: 500 [Urine:500]  FHT:  FHR: 140 bpm, variability: moderate,  accelerations:  Present,  decelerations:  Present x1 late, variable UC:   regular, every 2.5-4 minutes SVE:   Dilation: Lip/rim Effacement (%): 100 Station: 0 Exam by:: Michail Jewels MD  Labs: Lab Results  Component Value Date   WBC 7.4 08/01/2013   HGB 10.5* 08/01/2013   HCT 30.6* 08/01/2013   MCV 93.3 08/01/2013   PLT 237 08/01/2013    Assessment / Plan: Spontaneous labor, progressing normally s/p AROM  Labor: Progressing normally Preeclampsia:  n/a Fetal Wellbeing:  Category II Pain Control:  Epidural I/D:  n/a Anticipated MOD:  NSVD  Lynn Lynch 08/02/2013, 5:22 AM

## 2013-08-02 NOTE — Brief Op Note (Signed)
08/01/2013 - 08/02/2013  12:51 PM  PATIENT:  Lynn Lynch  21 y.o. female  PRE-OPERATIVE DIAGNOSIS:  desire sterilization  POST-OPERATIVE DIAGNOSIS:  desire sterilization  PROCEDURE:  Procedure(s): POST PARTUM TUBAL LIGATION (Bilateral)  SURGEON:  Surgeon(s) and Role:    * Willodean Rosenthal, MD - Primary  PHYSICIAN ASSISTANT:   ASSISTANTS: none   ANESTHESIA:   local and epidural  EBL:  Total I/O In: 1000 [I.V.:1000] Out: -   BLOOD ADMINISTERED:none  DRAINS: none   LOCAL MEDICATIONS USED:  MARCAINE     SPECIMEN:  No Specimen  DISPOSITION OF SPECIMEN:  N/A  COUNTS:  YES  TOURNIQUET:  * No tourniquets in log *  DICTATION: .Note written in EPIC  PLAN OF CARE: patinet already admitted  PATIENT DISPOSITION:  PACU - hemodynamically stable.   Delay start of Pharmacological VTE agent (>24hrs) due to surgical blood loss or risk of bleeding: yes

## 2013-08-02 NOTE — Anesthesia Postprocedure Evaluation (Signed)
  Anesthesia Post Note  Patient: Lynn Lynch  Procedure(s) Performed: Procedure(s) (LRB): POST PARTUM TUBAL LIGATION (Bilateral)  Anesthesia type: Epidural  Patient location: PACU  Post pain: Pain level controlled  Post assessment: Post-op Vital signs reviewed  Last Vitals:  Filed Vitals:   08/02/13 1230  BP:   Pulse: 82  Temp:   Resp: 18    Post vital signs: Reviewed  Level of consciousness: awake  Complications: No apparent anesthesia complications

## 2013-08-02 NOTE — Anesthesia Preprocedure Evaluation (Addendum)
Anesthesia Evaluation  Patient identified by MRN, date of birth, ID band Patient awake    Reviewed: Allergy & Precautions, H&P , Patient's Chart, lab work & pertinent test results  Airway Mallampati: III TM Distance: >3 FB Neck ROM: Full    Dental no notable dental hx. (+) Teeth Intact   Pulmonary neg pulmonary ROS,  breath sounds clear to auscultation  Pulmonary exam normal       Cardiovascular hypertension, negative cardio ROS  Rhythm:Regular Rate:Normal     Neuro/Psych negative neurological ROS  negative psych ROS   GI/Hepatic negative GI ROS, Neg liver ROS,   Endo/Other  Morbid obesity  Renal/GU negative Renal ROS  negative genitourinary   Musculoskeletal negative musculoskeletal ROS (+)   Abdominal (+) + obese,   Peds  Hematology negative hematology ROS (+)   Anesthesia Other Findings   Reproductive/Obstetrics (+) Pregnancy (s/p SVD 11/02/12 on 0648, for PPBTL) and Breast feeding                           Anesthesia Physical  Anesthesia Plan  ASA: III  Anesthesia Plan: Epidural   Post-op Pain Management:    Induction:   Airway Management Planned: Natural Airway  Additional Equipment:   Intra-op Plan:   Post-operative Plan:   Informed Consent: I have reviewed the patients History and Physical, chart, labs and discussed the procedure including the risks, benefits and alternatives for the proposed anesthesia with the patient or authorized representative who has indicated his/her understanding and acceptance.     Plan Discussed with: Anesthesiologist  Anesthesia Plan Comments:         Anesthesia Quick Evaluation

## 2013-08-02 NOTE — Progress Notes (Signed)
UR completed 

## 2013-08-02 NOTE — Transfer of Care (Signed)
Immediate Anesthesia Transfer of Care Note  Patient: Lynn Lynch  Procedure(s) Performed: Procedure(s): POST PARTUM TUBAL LIGATION (Bilateral)  Patient Location: PACU  Anesthesia Type:Epidural  Level of Consciousness: awake  Airway & Oxygen Therapy: Patient Spontanous Breathing  Post-op Assessment: Report given to PACU RN  Post vital signs: Reviewed and stable  Complications: No apparent anesthesia complications

## 2013-08-02 NOTE — Progress Notes (Signed)
Lynn Lynch is a 22 y.o. G3P1011 at [redacted]w[redacted]d admitted for active labor  Subjective:  Pt doing well. Comfortable with epidural. +FM.   Objective: BP 122/59  Pulse 87  Temp(Src) 97.7 F (36.5 C) (Oral)  Resp 18  Ht 5\' 3"  (1.6 m)  Wt 107.049 kg (236 lb)  BMI 41.82 kg/m2  SpO2 100%  LMP 10/30/2012      FHT:  FHR: 120s bpm, variability: moderate,  accelerations:  Present,  decelerations:  Present earlys UC:   irregular, every 1-5 minutes SVE:   Dilation: 7 Effacement (%): 90 Station: -2 Exam by:: Jazmene Racz MD  Labs: Lab Results  Component Value Date   WBC 7.4 08/01/2013   HGB 10.5* 08/01/2013   HCT 30.6* 08/01/2013   MCV 93.3 08/01/2013   PLT 237 08/01/2013    Assessment / Plan: Spontaneous labor, progressing normally  Labor: Progressing normally Fetal Wellbeing:  Category I Pain Control:  Epidural I/D:  n/a Anticipated MOD:  NSVD  Lynn Lynch 08/02/2013, 12:20 AM

## 2013-08-02 NOTE — Op Note (Signed)
Lynn Lynch 08/01/2013 - 08/02/2013  PREOPERATIVE DIAGNOSIS:  Multiparity, undesired fertility  POSTOPERATIVE DIAGNOSIS:  Multiparity, undesired fertility  PROCEDURE:  Postpartum Bilateral Tubal Sterilization using Filshie Clips   ANESTHESIA:  Epidural and local analgesia using 0.5% Marcaine  COMPLICATIONS:  None immediate.  ESTIMATED BLOOD LOSS: 5 ml.  INDICATIONS: 22 y.o. [id number]  with undesired fertility,status post vaginal delivery, desires permanent sterilization.  Other reversible forms of contraception were discussed with patient; she declines all other modalities. Risks of procedure discussed with patient including but not limited to: risk of regret, permanence of method, bleeding, infection, injury to surrounding organs and need for additional procedures.  Failure risk of 0.5-1% with increased risk of ectopic gestation if pregnancy occurs was also discussed with patient.     FINDINGS:  Normal uterus, tubes, and ovaries.  PROCEDURE DETAILS: The patient was taken to the operating room where her epidural anesthesia was dosed up to surgical level and found to be adequate.  She was then placed in the dorsal supine position and prepped and draped in sterile fashion.  After an adequate timeout was performed, attention was turned to the patient's abdomen where a small transverse skin incision was made under the umbilical fold. The incision was taken down to the layer of fascia using the scalpel, and fascia was incised, and extended bilaterally using Mayo scissors. The peritoneum was entered in a sharp fashion. Attention was then turned to the patient's uterus, and left fallopian tube was identified and followed out to the fimbriated end.  A Filshie clip was placed on the left fallopian tube about 3 cm from the cornual attachment, with care given to incorporate the underlying mesosalpinx.  A similar process was carried out on the right side allowing for bilateral tubal sterilization.  Good  hemostasis was noted overall.  The instruments were then removed from the patient's abdomen and the fascial incision was repaired with 0 Vicryl, and the skin was closed with a 4-0 Vicryl subcuticular stitch. The patient tolerated the procedure well.  Instrument, sponge, and needle counts were correct times two.  The patient was then taken to the recovery room awake and in stable condition.

## 2013-08-03 ENCOUNTER — Encounter (HOSPITAL_COMMUNITY): Payer: Self-pay | Admitting: Obstetrics & Gynecology

## 2013-08-03 NOTE — Progress Notes (Signed)
I have seen and examined this patient and I agree with the above. Cam Hai 6:37 PM 08/03/2013

## 2013-08-03 NOTE — Anesthesia Postprocedure Evaluation (Signed)
  Anesthesia Post-op Note  Patient: Lynn Lynch  Procedure(s) Performed: * No procedures listed *  Patient Location: Mother/Baby  Anesthesia Type:Epidural  Level of Consciousness: awake, alert , oriented and patient cooperative  Airway and Oxygen Therapy: Patient Spontanous Breathing  Post-op Pain: mild  Post-op Assessment: Patient's Cardiovascular Status Stable, Respiratory Function Stable, No headache, No backache, No residual numbness and No residual motor weakness  Post-op Vital Signs: stable  Complications: No apparent anesthesia complications

## 2013-08-03 NOTE — Progress Notes (Signed)
Post Partum Day 1, Post Op day 1 (BLT) Subjective: no complaints, up ad lib and tolerating PO, doing well, breast feeding baby girl, would like another day to work on increasing ambulation  Objective: Blood pressure 119/65, pulse 78, temperature 97.6 F (36.4 C), temperature source Oral, resp. rate 18, height 5\' 3"  (1.6 m), weight 107.049 kg (236 lb), last menstrual period 10/30/2012, SpO2 99.00%, unknown if currently breastfeeding.  Physical Exam:  General: alert, cooperative and no distress Lochia: appropriate Uterine Fundus: firm at umbilicus  Incision: midline incision with no surrounding erythema/edema, no drainage, dressing intact DVT Evaluation: No evidence of DVT seen on physical exam.   Recent Labs  08/01/13 2200 08/02/13 1245  HGB 10.5* 9.7*  HCT 30.6* 27.5*    Assessment/Plan: Plan for discharge tomorrow and Breastfeeding Pt doing well, no acute issues Would encourage increased ambulation today as tolerated Will continue to work on breast feeding S/p BTL for contraception F/up in [facility] in 4-6 weeks postpartum   LOS: 2 days   Lynn Lynch 08/03/2013, 7:32 AM

## 2013-08-04 ENCOUNTER — Encounter: Payer: Self-pay | Admitting: Advanced Practice Midwife

## 2013-08-04 MED ORDER — OXYCODONE-ACETAMINOPHEN 5-325 MG PO TABS: 1.0000 | ORAL_TABLET | ORAL | Status: DC | PRN

## 2013-08-04 MED ORDER — IBUPROFEN 600 MG PO TABS: 600.0000 mg | ORAL_TABLET | Freq: Four times a day (QID) | ORAL | Status: DC

## 2013-08-04 MED ORDER — POLYETHYLENE GLYCOL 3350 17 GM/SCOOP PO POWD: 17.0000 g | Freq: Every day | ORAL | Status: DC

## 2013-08-04 NOTE — Discharge Summary (Signed)
Obstetric Discharge Summary Reason for Admission: onset of labor Prenatal Procedures: NST Intrapartum Procedures: spontaneous vaginal delivery Postpartum Procedures: P.P. tubal ligation Complications-Operative and Postpartum: none Hemoglobin  Date Value Range Status  08/02/2013 9.7* 12.0 - 15.0 g/dL Final     HCT  Date Value Range Status  08/02/2013 27.5* 36.0 - 46.0 % Final    Physical Exam:  General: alert, cooperative, appears stated age and no distress Lochia: appropriate Uterine Fundus: firm Incision: dressing clean and dry DVT Evaluation: No evidence of DVT seen on physical exam.  Discharge Diagnoses: Term Pregnancy-delivered  Discharge Information: Date: 08/04/2013 Activity: pelvic rest Diet: routine Medications: Ibuprofen, Percocet and Miralax Condition: stable Instructions: refer to practice specific booklet Discharge to: home Follow-up Information   Follow up with [facility]. Schedule an appointment as soon as possible for a visit in 4 weeks. (for postpartum visit)    Specialty:  Obstetrics and Gynecology   Contact information:   [address] [place] [address] [phone]      Newborn Data: Live born female  Birth Weight: 7 lb 10.2 oz (3465 g) APGAR: 8, 9  Home with mother.  Hospital course: Pt presented in active labor, made appropriate cervical change and progressed to deliver liveborn female infant via NSVD. No complications. Postpartum tubal was performed with filshie clips. Postpartum care was otherwise uncomplicated. She is bottle feeding.  Lynn Lynch, Lynn Lynch 08/04/2013, 9:57 AM  I have seen and examined this patient and agree with above documentation in the resident's note. Agree with hospital course above.    Rulon Abide, M.D. [facility] Fellow 08/04/2013 10:13 AM

## 2013-08-10 NOTE — Discharge Summary (Signed)
Attestation of Attending Supervision of Fellow: Evaluation and management procedures were performed by the Fellow under my supervision and collaboration.  I have reviewed the Fellow's note and chart, and I agree with the management and plan.    

## 2013-09-05 ENCOUNTER — Ambulatory Visit: Payer: Medicaid Other | Admitting: Advanced Practice Midwife

## 2013-09-07 ENCOUNTER — Encounter: Payer: Self-pay | Admitting: *Deleted

## 2013-09-15 ENCOUNTER — Encounter: Payer: Self-pay | Admitting: *Deleted

## 2013-11-30 ENCOUNTER — Encounter: Payer: Self-pay | Admitting: *Deleted

## 2014-01-19 NOTE — MAU Provider Note (Signed)
Attestation of Attending Supervision of Obstetric Fellow: Evaluation and management procedures were performed by the Obstetric Fellow under my supervision and collaboration.  I have reviewed the Obstetric Fellow's note and chart, and I agree with the management and plan.  Brynnley Dayrit, MD, FACOG Attending Obstetrician & Gynecologist Faculty Practice, [facility]   

## 2014-07-24 ENCOUNTER — Encounter (HOSPITAL_COMMUNITY): Payer: Self-pay | Admitting: Emergency Medicine

## 2014-07-24 ENCOUNTER — Emergency Department (HOSPITAL_COMMUNITY)
Admission: EM | Admit: 2014-07-24 | Discharge: 2014-07-25 | Disposition: A | Discharge status: Home / Self Care | Payer: Medicaid Other | Attending: Emergency Medicine | Admitting: Emergency Medicine

## 2014-07-24 DIAGNOSIS — N739 Female pelvic inflammatory disease, unspecified: Secondary | ICD-10-CM | POA: Insufficient documentation

## 2014-07-24 DIAGNOSIS — Z87891 Personal history of nicotine dependence: Secondary | ICD-10-CM | POA: Insufficient documentation

## 2014-07-24 DIAGNOSIS — R109 Unspecified abdominal pain: Secondary | ICD-10-CM | POA: Diagnosis not present

## 2014-07-24 DIAGNOSIS — Z3202 Encounter for pregnancy test, result negative: Secondary | ICD-10-CM | POA: Insufficient documentation

## 2014-07-24 DIAGNOSIS — N898 Other specified noninflammatory disorders of vagina: Secondary | ICD-10-CM | POA: Diagnosis present

## 2014-07-24 LAB — URINALYSIS, ROUTINE W REFLEX MICROSCOPIC
GLUCOSE, UA: NEGATIVE mg/dL
HGB URINE DIPSTICK: NEGATIVE
Ketones, ur: 15 mg/dL — AB
Nitrite: NEGATIVE
Protein, ur: 30 mg/dL — AB
Specific Gravity, Urine: 1.037 — ABNORMAL HIGH (ref 1.005–1.030)
Urobilinogen, UA: 1 mg/dL (ref 0.0–1.0)
pH: 5.5 (ref 5.0–8.0)

## 2014-07-24 LAB — WET PREP, GENITAL
TRICH WET PREP: NONE SEEN
Yeast Wet Prep HPF POC: NONE SEEN

## 2014-07-24 LAB — URINE MICROSCOPIC-ADD ON

## 2014-07-24 LAB — POC URINE PREG, ED: Preg Test, Ur: NEGATIVE

## 2014-07-24 MED ORDER — METRONIDAZOLE 500 MG PO TABS
2000.0000 mg | ORAL_TABLET | Freq: Once | ORAL | Status: AC
  Administered 2014-07-25: 2000 mg via ORAL
  Filled 2014-07-24: qty 4

## 2014-07-24 MED ORDER — CEFTRIAXONE SODIUM 250 MG IJ SOLR
250.0000 mg | INTRAMUSCULAR | Status: DC
Start: 2014-07-24 — End: 2014-07-25
  Administered 2014-07-25: 250 mg via INTRAMUSCULAR
  Filled 2014-07-24: qty 250

## 2014-07-24 MED ORDER — LIDOCAINE HCL (PF) 1 % IJ SOLN
INTRAMUSCULAR | Status: AC
  Filled 2014-07-24: qty 5

## 2014-07-24 MED ORDER — LIDOCAINE HCL (PF) 1 % IJ SOLN
0.9000 mL | Freq: Once | INTRAMUSCULAR | Status: AC
  Administered 2014-07-25: 0.9 mL

## 2014-07-24 MED ORDER — AZITHROMYCIN 250 MG PO TABS
1000.0000 mg | ORAL_TABLET | Freq: Once | ORAL | Status: AC
  Administered 2014-07-25: 1000 mg via ORAL
  Filled 2014-07-24: qty 4

## 2014-07-24 NOTE — ED Notes (Signed)
Pt c/o lower back pain with radiation to lower abd and vaginal discharge with odor; pt sts LMP was 1 week ago; pt sts some cough and pain with cough starting today

## 2014-07-25 MED ORDER — HYDROCODONE-ACETAMINOPHEN 5-325 MG PO TABS: 1.0000 | ORAL_TABLET | ORAL | Status: DC | PRN

## 2014-07-25 MED ORDER — DOXYCYCLINE HYCLATE 100 MG PO CAPS: 100.0000 mg | ORAL_CAPSULE | Freq: Two times a day (BID) | ORAL | Status: DC

## 2014-07-25 NOTE — Discharge Instructions (Signed)
Pelvic Inflammatory Disease °Pelvic inflammatory disease (PID) is an infection in some or all of the female organs. PID can be in the uterus, ovaries, fallopian tubes, or the surrounding tissues inside the lower belly area (pelvis). °HOME CARE  °· If given, take your antibiotic medicine as told. Finish them even if you start to feel better. °· Only take medicine as told by your doctor. °· Do not have sex (intercourse) until treatment is done or as told by your doctor. °· Tell your sex partner if you have PID. Your partner may need to be treated. °· Keep all doctor visits. °GET HELP RIGHT AWAY IF:  °· You have a fever. °· You have more belly (abdominal) or lower belly pain. °· You have chills. °· You have pain when you pee (urinate). °· You are not better after 72 hours. °· You have more fluid (discharge) coming from your vagina or fluid that is not normal. °· You need pain medicine from your doctor. °· You throw up (vomit). °· You cannot take your medicines. °· Your partner has a sexually transmitted disease (STD). °MAKE SURE YOU:  °· Understand these instructions. °· Will watch your condition. °· Will get help right away if you are not doing well or get worse. °Document Released: 12/26/2008 Document Revised: 01/24/2013 Document Reviewed: 09/25/2011 °[employer]® Patient Information ©2015 [employer]. This information is not intended to replace advice given to you by your health care provider. Make sure you discuss any questions you have with your health care provider. ° °

## 2014-07-25 NOTE — ED Provider Notes (Signed)
CSN: [id number]     Arrival date & time 07/24/14  1722 History   First MD Initiated Contact with Patient 07/24/14 2221     Chief Complaint  Patient presents with  . Cough  . Vaginal Discharge  . Abdominal Pain      HPI  History presents with vaginal discharge for 4-5 days. She states is similar to when she had chlamydia twice during her recent pregnancy. Still the same partner. Suprapubic abdominal pain for the last 2-3 days. Suprapubic and adnexal. Presents here.  Past Medical History  Diagnosis Date  . Gestational hypertension   . Pregnancy induced hypertension     G1 - Pre-E   Past Surgical History  Procedure Laterality Date  . No past surgeries    . Tubal ligation Bilateral 08/02/2013    Procedure: POST PARTUM TUBAL LIGATION;  Surgeon: Willodean Rosenthalarolyn Harraway-Smith, MD;  Location: [facility];  Service: Gynecology;  Laterality: Bilateral;   Family History  Problem Relation Age of Onset  . Hypertension Mother    History  Substance Use Topics  . Smoking status: Former Smoker    Quit date: 10/21/2011  . Smokeless tobacco: Never Used  . Alcohol Use: No   OB History   Grav Para Term Preterm Abortions TAB SAB Ect Mult Living   3 2 2  0 1 0 1 0 0 2     Review of Systems  Constitutional: Negative for fever, chills, diaphoresis, appetite change and fatigue.  HENT: Negative for mouth sores, sore throat and trouble swallowing.   Eyes: Negative for visual disturbance.  Respiratory: Negative for cough, chest tightness, shortness of breath and wheezing.   Cardiovascular: Negative for chest pain.  Gastrointestinal: Positive for abdominal pain. Negative for nausea, vomiting, diarrhea and abdominal distention.  Endocrine: Negative for polydipsia, polyphagia and polyuria.  Genitourinary: Positive for vaginal discharge and dyspareunia. Negative for dysuria, frequency and hematuria.  Musculoskeletal: Negative for gait problem.  Skin: Negative for color change, pallor and rash.    Neurological: Negative for dizziness, syncope, light-headedness and headaches.  Hematological: Does not bruise/bleed easily.  Psychiatric/Behavioral: Negative for behavioral problems and confusion.      Allergies  Review of patient's allergies indicates no known allergies.  Home Medications   Prior to Admission medications   Not on File   BP 109/65  Pulse 54  Temp(Src) 97.7 F (36.5 C) (Oral)  Resp 18  SpO2 93% Physical Exam  Constitutional: She is oriented to person, place, and time. She appears well-developed and well-nourished. No distress.  HENT:  Head: Normocephalic.  Eyes: Conjunctivae are normal. Pupils are equal, round, and reactive to light. No scleral icterus.  Neck: Normal range of motion. Neck supple. No thyromegaly present.  Cardiovascular: Normal rate and regular rhythm.  Exam reveals no gallop and no friction rub.   No murmur heard. Pulmonary/Chest: Effort normal and breath sounds normal. No respiratory distress. She has no wheezes. She has no rales.  Abdominal: Soft. Bowel sounds are normal. She exhibits no distension. There is no tenderness. There is no rebound.  Genitourinary:    Musculoskeletal: Normal range of motion.  Neurological: She is alert and oriented to person, place, and time.  Skin: Skin is warm and dry. No rash noted.  Psychiatric: She has a normal mood and affect. Her behavior is normal.    ED Course  Procedures (including critical care time) Labs Review Labs Reviewed  WET PREP, GENITAL - Abnormal; Notable for the following:    Clue Cells Wet Prep HPF  POC FEW (*)    WBC, Wet Prep HPF POC FEW (*)    All other components within normal limits  URINALYSIS, ROUTINE W REFLEX MICROSCOPIC - Abnormal; Notable for the following:    Color, Urine AMBER (*)    APPearance TURBID (*)    Specific Gravity, Urine 1.037 (*)    Bilirubin Urine SMALL (*)    Ketones, ur 15 (*)    Protein, ur 30 (*)    Leukocytes, UA MODERATE (*)    All other  components within normal limits  URINE MICROSCOPIC-ADD ON - Abnormal; Notable for the following:    Squamous Epithelial / LPF MANY (*)    Bacteria, UA MANY (*)    All other components within normal limits  GC/CHLAMYDIA PROBE AMP  POC URINE PREG, ED    Imaging Review No results found.   EKG Interpretation None      MDM   Final diagnoses:  Pelvic inflammatory disease    Exam discharge consistent with PID. Cultures obtained. Given antiemetic treatment here. Discharged with doxycycline and Vicodin. Encourage women's health followup.    Rolland PorterMark Ynez Eugenio, MD 07/25/14 [phone]

## 2014-07-26 LAB — GC/CHLAMYDIA PROBE AMP
CT PROBE, AMP APTIMA: POSITIVE — AB
GC Probe RNA: NEGATIVE

## 2014-07-30 ENCOUNTER — Telehealth (HOSPITAL_BASED_OUTPATIENT_CLINIC_OR_DEPARTMENT_OTHER): Payer: Self-pay | Admitting: Emergency Medicine

## 2014-07-30 NOTE — Telephone Encounter (Signed)
Positive Chlamydia culture Treated with Zithromax per protocol MD [facility] faxed.  07/29/14 patient notified

## 2014-08-14 ENCOUNTER — Encounter (HOSPITAL_COMMUNITY): Payer: Self-pay | Admitting: Emergency Medicine

## 2016-11-02 ENCOUNTER — Encounter (HOSPITAL_COMMUNITY): Payer: Self-pay | Admitting: Emergency Medicine

## 2016-11-02 ENCOUNTER — Emergency Department (HOSPITAL_COMMUNITY)
Admission: EM | Admit: 2016-11-02 | Discharge: 2016-11-02 | Disposition: A | Discharge status: Home / Self Care | Payer: Medicaid Other | Attending: Emergency Medicine | Admitting: Emergency Medicine

## 2016-11-02 ENCOUNTER — Emergency Department (HOSPITAL_COMMUNITY): Payer: Medicaid Other

## 2016-11-02 DIAGNOSIS — R102 Pelvic and perineal pain: Secondary | ICD-10-CM | POA: Insufficient documentation

## 2016-11-02 DIAGNOSIS — Z87891 Personal history of nicotine dependence: Secondary | ICD-10-CM | POA: Diagnosis not present

## 2016-11-02 DIAGNOSIS — D649 Anemia, unspecified: Secondary | ICD-10-CM | POA: Insufficient documentation

## 2016-11-02 DIAGNOSIS — Z79899 Other long term (current) drug therapy: Secondary | ICD-10-CM | POA: Diagnosis not present

## 2016-11-02 DIAGNOSIS — N730 Acute parametritis and pelvic cellulitis: Secondary | ICD-10-CM | POA: Diagnosis not present

## 2016-11-02 DIAGNOSIS — R1031 Right lower quadrant pain: Secondary | ICD-10-CM | POA: Diagnosis present

## 2016-11-02 LAB — CBC WITH DIFFERENTIAL/PLATELET
BASOS ABS: 0 10*3/uL (ref 0.0–0.1)
BASOS PCT: 0 %
Eosinophils Absolute: 0.1 10*3/uL (ref 0.0–0.7)
Eosinophils Relative: 1 %
HEMATOCRIT: 31.8 % — AB (ref 36.0–46.0)
HEMOGLOBIN: 10.7 g/dL — AB (ref 12.0–15.0)
Lymphocytes Relative: 32 %
Lymphs Abs: 2.1 10*3/uL (ref 0.7–4.0)
MCH: 30.1 pg (ref 26.0–34.0)
MCHC: 33.6 g/dL (ref 30.0–36.0)
MCV: 89.3 fL (ref 78.0–100.0)
MONOS PCT: 6 %
Monocytes Absolute: 0.4 10*3/uL (ref 0.1–1.0)
NEUTROS ABS: 4.1 10*3/uL (ref 1.7–7.7)
Neutrophils Relative %: 61 %
Platelets: 302 10*3/uL (ref 150–400)
RBC: 3.56 MIL/uL — AB (ref 3.87–5.11)
RDW: 14.4 % (ref 11.5–15.5)
WBC: 6.7 10*3/uL (ref 4.0–10.5)

## 2016-11-02 LAB — COMPREHENSIVE METABOLIC PANEL
ALBUMIN: 3.8 g/dL (ref 3.5–5.0)
ALT: 14 U/L (ref 14–54)
AST: 18 U/L (ref 15–41)
Alkaline Phosphatase: 41 U/L (ref 38–126)
Anion gap: 8 (ref 5–15)
BUN: 15 mg/dL (ref 6–20)
CO2: 25 mmol/L (ref 22–32)
Calcium: 9.2 mg/dL (ref 8.9–10.3)
Chloride: 102 mmol/L (ref 101–111)
Creatinine, Ser: 0.7 mg/dL (ref 0.44–1.00)
GFR calc non Af Amer: >60 mL/min (ref >60)
GLUCOSE: 103 mg/dL — AB (ref 65–99)
Potassium: 4 mmol/L (ref 3.5–5.1)
Sodium: 135 mmol/L (ref 135–145)
Total Bilirubin: 0.2 mg/dL — ABNORMAL LOW (ref 0.3–1.2)
Total Protein: 8 g/dL (ref 6.5–8.1)

## 2016-11-02 LAB — URINALYSIS, ROUTINE W REFLEX MICROSCOPIC
Bilirubin Urine: NEGATIVE
Glucose, UA: NEGATIVE mg/dL
Hgb urine dipstick: NEGATIVE
KETONES UR: NEGATIVE mg/dL
Nitrite: NEGATIVE
Protein, ur: NEGATIVE mg/dL
Specific Gravity, Urine: 1.026 (ref 1.005–1.030)
pH: 5 (ref 5.0–8.0)

## 2016-11-02 LAB — HIV ANTIBODY (ROUTINE TESTING W REFLEX): HIV SCREEN 4TH GENERATION: NONREACTIVE

## 2016-11-02 LAB — I-STAT BETA HCG BLOOD, ED (MC, WL, AP ONLY)

## 2016-11-02 LAB — WET PREP, GENITAL
Sperm: NONE SEEN
Trich, Wet Prep: NONE SEEN
YEAST WET PREP: NONE SEEN

## 2016-11-02 LAB — POC URINE PREG, ED: Preg Test, Ur: POSITIVE — AB

## 2016-11-02 LAB — HCG, QUANTITATIVE, PREGNANCY: hCG, Beta Chain, Quant, S: <1 m[IU]/mL (ref <5)

## 2016-11-02 LAB — RPR: RPR: NONREACTIVE

## 2016-11-02 LAB — LIPASE, BLOOD: Lipase: 20 U/L (ref 11–51)

## 2016-11-02 MED ORDER — CEFTRIAXONE SODIUM 250 MG IJ SOLR
250.0000 mg | Freq: Once | INTRAMUSCULAR | Status: AC
  Administered 2016-11-02: 250 mg via INTRAMUSCULAR
  Filled 2016-11-02: qty 250

## 2016-11-02 MED ORDER — METRONIDAZOLE 500 MG PO TABS: 500.0000 mg | ORAL_TABLET | Freq: Two times a day (BID) | ORAL | 0 refills | Status: AC

## 2016-11-02 MED ORDER — METRONIDAZOLE 500 MG PO TABS
500.0000 mg | ORAL_TABLET | Freq: Once | ORAL | Status: AC
  Administered 2016-11-02: 500 mg via ORAL
  Filled 2016-11-02: qty 1

## 2016-11-02 MED ORDER — DOXYCYCLINE HYCLATE 100 MG PO CAPS: 100.0000 mg | ORAL_CAPSULE | Freq: Two times a day (BID) | ORAL | 0 refills | Status: AC

## 2016-11-02 MED ORDER — DOXYCYCLINE HYCLATE 100 MG PO TABS
100.0000 mg | ORAL_TABLET | Freq: Once | ORAL | Status: AC
  Administered 2016-11-02: 100 mg via ORAL
  Filled 2016-11-02: qty 1

## 2016-11-02 MED ORDER — HYDROCODONE-ACETAMINOPHEN 5-325 MG PO TABS: 1.0000 | ORAL_TABLET | Freq: Four times a day (QID) | ORAL | 0 refills | Status: DC | PRN

## 2016-11-02 MED ORDER — LIDOCAINE HCL 1 % IJ SOLN
INTRAMUSCULAR | Status: AC
  Administered 2016-11-02: 1.1 mL
  Filled 2016-11-02: qty 20

## 2016-11-02 NOTE — ED Provider Notes (Signed)
10:22 AM  Care assumed from Dr. Preston FleetingGlick while awaiting diagnostic laboratory and imaging testing. At time of signout, patient is awaiting results of pelvic ultrasounds and repeat pregnancy testing. According to previous team, patient has lower abdominal pain. On pelvic exam, patient had a right-sided adnexal tenderness. Initial pregnancy test was positive on urine testing. On blood testing, it was negative. Patient had a quantitative hCG test performed and this was also negative. Doubt pregnancy at this time.  Ultrasound showed no evidence of intrauterine pregnancy. There was some endometrial wall thickening that radiology recommends follow-up ultrasound for evaluation. Patient also had evidence of a hemorrhagic follicle on the left.  Given negative pregnancy testing on repeat, doubt ectopic pregnancy at this time however, patient was advised of possibility of ectopic. Since pelvic exam had adnexal tenderness and there were leukocytes on swabs with clue cells, patient will be treated for PID. Patient given Rocephin shot and one dose of doxycycline and Flagyl in ED. Patient will be prescribed Flagyl and doxycycline for discharge antibiotics. Patient also given pain medicine for her lower abdominal discomfort.  Patient given instructions to follow-up with her OB/GYN team for the repeat ultrasound as well as further management. Patient advised if any symptoms worsen or she begins having any signs and symptoms of ectopic pregnancy, she should return to the emergency department immediately. Patient was understanding of this plan. Patient had no other complaints. Patient will be discharged after medications are administered.     Clinical Impression: 1. Normochromic normocytic anemia   2. Pelvic pain in female   3. PID (acute pelvic inflammatory disease)     Disposition: Discharge  Condition: Good  I have discussed the results, Dx and Tx plan with the pt(& family if present). He/she/they expressed  understanding and agree(s) with the plan. Discharge instructions discussed at great length. Strict return precautions discussed and pt &/or family have verbalized understanding of the instructions. No further questions at time of discharge.    Discharge Medication List as of 11/02/2016 10:27 AM    START taking these medications   Details  metroNIDAZOLE (FLAGYL) 500 MG tablet Take 1 tablet (500 mg total) by mouth 2 (two) times daily., Starting Sun 11/02/2016, Until Sun 11/16/2016, Print        [facility]: [facility] [address] [place] [id number] [phone] Schedule an appointment as soon as possible for a visit    [facility] [address] [id number] [place] [id number] [phone]  If symptoms worsen     Heide Scaleshristopher J Tegeler, MD 11/02/16 [phone]

## 2016-11-02 NOTE — Discharge Instructions (Signed)
Please take her antibiotics as prescribed twice a day for 2 weeks to help your pelvic infection. Please take your pain medicine as needed for the discomfort. Please follow-up with her OB/GYN team for further observation and management of the ovarian  follicle and your uterine wall thickening. If any symptoms worsen, please return to the nearest emergency department.

## 2016-11-02 NOTE — ED Notes (Signed)
Patient transported to Ultrasound 

## 2016-11-02 NOTE — ED Triage Notes (Signed)
Pt is c/o right flank pain  Pt states it started last night around 9pm  Pt states the pain is worse in the front than the back and is sharp in nature  Pt denies N/V/D

## 2016-11-02 NOTE — ED Provider Notes (Signed)
[facility]-EMER[facility]ENCY DEPT Provider Note   CSN: [id number] Arrival date & time: 11/02/16  [id number]     History   Chief Complaint Chief Complaint  Patient presents with  . Flank Pain    HPI Lynn Lynch is a 26 y.o. female.  She complains of right suprapubic pain which started last night at about 8 PM and has been getting worse. She rates pain at 7/10. There is no radiation of pain. There is no associated nausea or vomiting. She denies any dysuria or urinary urgency or frequency or tenesmus. There has been a slight vaginal discharge. She denies any constipation or diarrhea. Pain is worse with movement. She has history of UTIs and states that this does not feel like a UTI. She is status post tubal ligation. Last menses was January 12 and normal.   The history is provided by the patient.  Flank Pain     Past Medical History:  Diagnosis Date  . Gestational hypertension   . Pregnancy induced hypertension    G1 - Pre-E    Patient Active Problem List   Diagnosis Date Noted  . Sterilization 08/02/2013  . Late prenatal care 04/07/2013  . Chlamydia infection, current pregnancy x 2 episodes 04/07/2013    Past Surgical History:  Procedure Laterality Date  . NO PAST SURGERIES    . TUBAL LIGATION Bilateral 08/02/2013   Procedure: POST PARTUM TUBAL LIGATION;  Surgeon: Willodean Rosenthalarolyn Harraway-Smith, MD;  Location: [facility];  Service: Gynecology;  Laterality: Bilateral;    OB History    Gravida Para Term Preterm AB Living   3 2 2  0 1 2   SAB TAB Ectopic Multiple Live Births   1 0 0 0 2       Home Medications    Prior to Admission medications   Medication Sig Start Date End Date Taking? Authorizing Provider  doxycycline (VIBRAMYCIN) 100 MG capsule Take 1 capsule (100 mg total) by mouth 2 (two) times daily. Patient not taking: Reported on 11/02/2016 07/25/14   Derwood KaplanAnkit Nanavati, MD  HYDROcodone-acetaminophen (NORCO/VICODIN) 5-325 MG per tablet Take 1 tablet by mouth every 4 (four) hours as  needed. Patient not taking: Reported on 11/02/2016 07/25/14   Derwood KaplanAnkit Nanavati, MD    Family History Family History  Problem Relation Age of Onset  . Hypertension Mother     Social History Social History  Substance Use Topics  . Smoking status: Former Smoker    Quit date: 10/21/2011  . Smokeless tobacco: Never Used  . Alcohol use No     Allergies   Patient has no known allergies.   Review of Systems Review of Systems  Genitourinary: Positive for flank pain.  All other systems reviewed and are negative.    Physical Exam Updated Vital Signs BP 139/97 (BP Location: Left Arm)   Pulse 83   Temp 98.3 F (36.8 C) (Oral)   Resp 18   Ht 5' (1.524 m)   Wt 265 lb 4 oz (120.3 kg)   LMP 09/23/2016 (Approximate)   SpO2 100%   BMI 51.80 kg/m   Physical Exam  Nursing note and vitals reviewed.  26 year old female, resting comfortably and in no acute distress. Vital signs are Significant for mild hypertension. Oxygen saturation is 100%, which is normal. Head is normocephalic and atraumatic. PERRLA, EOMI. Oropharynx is clear. Neck is nontender and supple without adenopathy or JVD. Back is nontender and there is no CVA tenderness. Lungs are clear without rales, wheezes, or rhonchi. Chest is nontender. Heart  has regular rate and rhythm without murmur. Abdomen is soft, flat, with moderate right suprapubic tenderness. There is no tenderness over McBurney's area. There is no rebound or guarding. There are no masses or hepatosplenomegaly and peristalsis is normoactive. Pelvic: Normal external female genitalia. Cervix is closed. Moderate amount of white vaginal discharge is present. Fundus is normal size and position. There is minimal cervical motion tenderness, but marked right adnexal tenderness. There is right adnexal fullness without any definite mass. Extremities have no cyanosis or edema, full range of motion is present. Skin is warm and dry without rash. Neurologic: Mental status  is normal, cranial nerves are intact, there are no motor or sensory deficits.  ED Treatments / Results  Labs (all labs ordered are listed, but only abnormal results are displayed) Labs Reviewed  WET PREP, GENITAL - Abnormal; Notable for the following:       Result Value   Clue Cells Wet Prep HPF POC PRESENT (*)    WBC, Wet Prep HPF POC FEW (*)    All other components within normal limits  URINALYSIS, ROUTINE W REFLEX MICROSCOPIC - Abnormal; Notable for the following:    APPearance HAZY (*)    Leukocytes, UA LARGE (*)    Bacteria, UA RARE (*)    Squamous Epithelial / LPF 6-30 (*)    All other components within normal limits  COMPREHENSIVE METABOLIC PANEL - Abnormal; Notable for the following:    Glucose, Bld 103 (*)    Total Bilirubin 0.2 (*)    All other components within normal limits  CBC WITH DIFFERENTIAL/PLATELET - Abnormal; Notable for the following:    RBC 3.56 (*)    Hemoglobin 10.7 (*)    HCT 31.8 (*)    All other components within normal limits  POC URINE PREG, ED - Abnormal; Notable for the following:    Preg Test, Ur POSITIVE (*)    All other components within normal limits  LIPASE, BLOOD  RPR  HIV ANTIBODY (ROUTINE TESTING)  HCG, QUANTITATIVE, PREGNANCY  I-STAT BETA HCG BLOOD, ED ([facility], [facility], [facility] ONLY)  GC/CHLAMYDIA PROBE AMP ([place]) NOT AT [facility]    Radiology No results found.  Procedures Procedures (including critical care time)  Medications Ordered in ED Medications - No data to display   Initial Impression / Assessment and Plan / ED Course  I have reviewed the triage vital signs and the nursing notes.  Pertinent labs & imaging results that were available during my care of the patient were reviewed by me and considered in my medical decision making (see chart for details).  Right suprapubic pain-possible ovarian cyst, possible pelvic inflammatory disease. Old records are reviewed, and she does have a prior visit for pelvic inflammatory disease. Also,  need to consider possibility of ectopic pregnancy. Pregnancy test will be checked. STI panel will be sent.  Pelvic exam is suggestive of ovarian cyst. Pelvic ultrasound will be obtained.  Urine pregnancy test is come back positive, but i-STAT hCG has come back negative. We'll get quantitative hCG to clarify pregnancy status. Case is signed out to Dr. Julieanne Manson. If hCG level indicates not pregnant, and ultrasound shows no evidence of ovarian cyst, would treat for pelvic inflammatory disease.  Final Clinical Impressions(s) / ED Diagnoses   Final diagnoses:  Pelvic pain in female  Normochromic normocytic anemia    New Prescriptions New Prescriptions   No medications on file     Dione Booze, MD 11/02/16 0830

## 2016-11-03 LAB — GC/CHLAMYDIA PROBE AMP (~~LOC~~) NOT AT ARMC
Chlamydia: NEGATIVE
Neisseria Gonorrhea: POSITIVE — AB

## 2017-03-15 ENCOUNTER — Encounter (HOSPITAL_COMMUNITY): Payer: Self-pay | Admitting: Emergency Medicine

## 2017-03-15 ENCOUNTER — Emergency Department (HOSPITAL_COMMUNITY)
Admission: EM | Admit: 2017-03-15 | Discharge: 2017-03-15 | Disposition: A | Discharge status: Home / Self Care | Payer: Medicaid Other | Attending: Emergency Medicine | Admitting: Emergency Medicine

## 2017-03-15 DIAGNOSIS — Z87891 Personal history of nicotine dependence: Secondary | ICD-10-CM | POA: Insufficient documentation

## 2017-03-15 DIAGNOSIS — N760 Acute vaginitis: Secondary | ICD-10-CM | POA: Insufficient documentation

## 2017-03-15 DIAGNOSIS — Z79899 Other long term (current) drug therapy: Secondary | ICD-10-CM | POA: Diagnosis not present

## 2017-03-15 DIAGNOSIS — N898 Other specified noninflammatory disorders of vagina: Secondary | ICD-10-CM | POA: Diagnosis present

## 2017-03-15 DIAGNOSIS — B9689 Other specified bacterial agents as the cause of diseases classified elsewhere: Secondary | ICD-10-CM | POA: Insufficient documentation

## 2017-03-15 LAB — PREGNANCY, URINE: Preg Test, Ur: NEGATIVE

## 2017-03-15 LAB — URINALYSIS, ROUTINE W REFLEX MICROSCOPIC
BILIRUBIN URINE: NEGATIVE
Glucose, UA: NEGATIVE mg/dL
Hgb urine dipstick: NEGATIVE
Ketones, ur: NEGATIVE mg/dL
Nitrite: NEGATIVE
PROTEIN: 30 mg/dL — AB
Specific Gravity, Urine: 1.031 — ABNORMAL HIGH (ref 1.005–1.030)
pH: 5 (ref 5.0–8.0)

## 2017-03-15 LAB — WET PREP, GENITAL
Sperm: NONE SEEN
Trich, Wet Prep: NONE SEEN
Yeast Wet Prep HPF POC: NONE SEEN

## 2017-03-15 MED ORDER — METRONIDAZOLE 500 MG PO TABS: 500.0000 mg | ORAL_TABLET | Freq: Two times a day (BID) | ORAL | 0 refills | Status: DC

## 2017-03-15 MED ORDER — METRONIDAZOLE 500 MG PO TABS
500.0000 mg | ORAL_TABLET | Freq: Once | ORAL | Status: AC
  Administered 2017-03-15: 500 mg via ORAL
  Filled 2017-03-15: qty 1

## 2017-03-15 MED ORDER — METRONIDAZOLE 500 MG PO TABS: 500.0000 mg | ORAL_TABLET | Freq: Two times a day (BID) | ORAL | 0 refills | Status: AC

## 2017-03-15 NOTE — ED Provider Notes (Signed)
[facility]ER[facility]PT Provider Note   CSN: [id number] Arrival date & time: 03/15/17  1627  By signing my name below, I, Rosario AdieWilliam Andrew Hiatt, attest that this documentation has been prepared under the direction and in the presence of [facility] Dahmir Epperly PA-C. Electronically Signed: Rosario AdieWilliam Andrew Hiatt, ED Scribe. 03/15/17. 5:26 PM.  History   Chief Complaint Chief Complaint  Patient presents with  . Urinary Frequency  . Vaginal Discharge   The history is provided by the patient. No language interpreter was used.    HPI Comments: Lynn Lynch is a 26 y.o. female who presents to the Emergency Department complaining of persistent, worsening right-sided lower back pain beginning two days ago. She notes associated vaginal burning, vaginal discharge, lower pressure-like sensation to the suprapubic abdomen, urinary frequency, and vaginal itching as well. Pt reports that she has a h/o UTIs and her pain today is similar to this. No treatments for her symptoms were tried prior to coming into the ED. No recent new sexual partners, and she is currently in a monogamous relationship with only one partner in the last six months. No h/o renal calculi. She denies dysuria, vaginal bleeding, hematuria, bloody stool, nausea, vomiting, chest pain, shortness of breath, fever, chills, or any other associated symptoms.   Past Medical History:  Diagnosis Date  . Gestational hypertension   . Pregnancy induced hypertension    G1 - Pre-E   Patient Active Problem List   Diagnosis Date Noted  . Sterilization 08/02/2013  . Late prenatal care 04/07/2013  . Chlamydia infection, current pregnancy x 2 episodes 04/07/2013   Past Surgical History:  Procedure Laterality Date  . NO PAST SURGERIES    . TUBAL LIGATION Bilateral 08/02/2013   Procedure: POST PARTUM TUBAL LIGATION;  Surgeon: Willodean Rosenthalarolyn Harraway-Smith, MD;  Location: [facility];  Service: Gynecology;  Laterality: Bilateral;   OB History    Gravida Para Term Preterm AB  Living   3 2 2  0 1 2   SAB TAB Ectopic Multiple Live Births   1 0 0 0 2     Home Medications    Prior to Admission medications   Medication Sig Start Date End Date Taking? Authorizing Provider  HYDROcodone-acetaminophen (NORCO/VICODIN) 5-325 MG tablet Take 1 tablet by mouth every 6 (six) hours as needed. Patient not taking: Reported on 03/15/2017 11/02/16   Tegeler, Canary Brimhristopher J, MD  metroNIDAZOLE (FLAGYL) 500 MG tablet Take 1 tablet (500 mg total) by mouth 2 (two) times daily. 03/15/17 03/22/17  Jeanie SewerFawze, Leslea Vowles A, PA-C   Family History Family History  Problem Relation Age of Onset  . Hypertension Mother    Social History Social History  Substance Use Topics  . Smoking status: Former Smoker    Quit date: 10/21/2011  . Smokeless tobacco: Never Used  . Alcohol use No   Allergies   Patient has no known allergies.  Review of Systems Review of Systems  Constitutional: Negative for chills and fever.  Respiratory: Negative for shortness of breath.   Cardiovascular: Negative for chest pain.  Gastrointestinal: Negative for abdominal pain, blood in stool, nausea and vomiting.  Genitourinary: Positive for frequency, vaginal discharge and vaginal pain. Negative for dysuria and vaginal bleeding.  Musculoskeletal: Positive for back pain.  All other systems reviewed and are negative.  Physical Exam Updated Vital Signs BP 135/89   Pulse 90   Temp 98.2 F (36.8 C) (Oral)   Resp 16   Ht 5\' 1"  (1.549 m)   Wt 102.1 kg (225 lb)  SpO2 100%   BMI 42.51 kg/m   Physical Exam  Constitutional: She appears well-developed and well-nourished. No distress.  HENT:  Head: Normocephalic and atraumatic.  Eyes: Conjunctivae are normal. Right eye exhibits no discharge. Left eye exhibits no discharge.  Neck: Normal range of motion. No JVD present. No tracheal deviation present.  Cardiovascular: Normal rate, regular rhythm and normal heart sounds.  Exam reveals no gallop and no friction rub.   No murmur  heard. Pulmonary/Chest: Effort normal and breath sounds normal. No respiratory distress. She has no wheezes. She has no rales.  Abdominal: Soft. Bowel sounds are normal. She exhibits no distension. There is tenderness.  Right lower quadrant mildly tender to palpation. No tenderness at McBurney's point, Rovsing's absent, Murphy's absent. No suprapubic tenderness  Genitourinary:  Genitourinary Comments: No CVA tenderness, but right lower back tender to palpation. Examination performed in the presence of a chaperone. No lesions to the external genitalia. Cervical os is closed with surrounding erythema. There is moderate amount of white discharge in the vaginal vault. No bleeding. No masses or lesions to the vaginal wall. There is mild cervical motion tenderness and mild tenderness to palpation of the suprapubic region. No adnexal tenderness.  Musculoskeletal: She exhibits no edema or deformity.  No midline spine TTP. Right lumbar region tender to palpation  Neurological: She is alert. No sensory deficit.  Fluent speech, no facial droop, sensation intact globally, normal gait  Skin: Skin is warm and dry. No erythema.  Psychiatric: She has a normal mood and affect. Her behavior is normal.  Nursing note and vitals reviewed.  ED Treatments / Results  DIAGNOSTIC STUDIES: Oxygen Saturation is 98% on RA, normal by my interpretation.   COORDINATION OF CARE: 5:25 PM-Discussed next steps with pt. Pt verbalized understanding and is agreeable with the plan.   Labs (all labs ordered are listed, but only abnormal results are displayed) Labs Reviewed  WET PREP, GENITAL - Abnormal; Notable for the following:       Result Value   Clue Cells Wet Prep HPF POC PRESENT (*)    WBC, Wet Prep HPF POC MODERATE (*)    All other components within normal limits  URINALYSIS, ROUTINE W REFLEX MICROSCOPIC - Abnormal; Notable for the following:    APPearance HAZY (*)    Specific Gravity, Urine 1.031 (*)    Protein,  ur 30 (*)    Leukocytes, UA MODERATE (*)    Bacteria, UA RARE (*)    Squamous Epithelial / LPF 6-30 (*)    All other components within normal limits  URINE CULTURE  PREGNANCY, URINE  RPR  HIV ANTIBODY (ROUTINE TESTING)  GC/CHLAMYDIA PROBE AMP ([place]) NOT AT [facility]    EKG  EKG Interpretation None      Radiology No results found.  Procedures Procedures   Medications Ordered in ED Medications  metroNIDAZOLE (FLAGYL) tablet 500 mg (500 mg Oral Given 03/15/17 1947)    Initial Impression / Assessment and Plan / ED Course  I have reviewed the triage vital signs and the nursing notes.  Pertinent labs & imaging results that were available during my care of the patient were reviewed by me and considered in my medical decision making (see chart for details).     Patient with right-sided low back pain, suprapubic pressure, and urinary frequency as well as vaginal itching and discharge. Afebrile, vital signs are stable. UA not entirely consistent with UTI, sent for culture. Low suspicion of pyelonephritis, TOA, ectopic pregnancy, appendicitis, ovarian  torsion. Low risk for STI. Wet prep with clue cells and PVCs consistent with bacterial vaginosis. First dose of Flagyl given here. Will discharge with Rx for remainder. Recommend follow-up with primary care this week for reevaluation. She is informed that if any of her remaining lab work results abnormally, she will be informed. Discussed indications for return to the ED. Pt verbalized understanding of and agreement with plan and is safe for discharge home at this time.   Final Clinical Impressions(s) / ED Diagnoses   Final diagnoses:  BV (bacterial vaginosis)   New Prescriptions Current Discharge Medication List    START taking these medications   Details  metroNIDAZOLE (FLAGYL) 500 MG tablet Take 1 tablet (500 mg total) by mouth 2 (two) times daily. Qty: 13 tablet, Refills: 0       I personally performed the services  described in this documentation, which was scribed in my presence. The recorded information has been reviewed and is accurate.     Jeanie Sewer, PA-C 03/15/17 Roselee Nova, MD 03/20/17 1801

## 2017-03-15 NOTE — ED Triage Notes (Signed)
Pt from home with complaints of middle lower back pain pain that began 2 days ago along with white vaginal discharge. Pt states today she has burning with urination and an increase in urinary frequency. Pt states she would like a pelvic exam

## 2017-03-15 NOTE — Discharge Instructions (Signed)
Please take all of your antibiotics until finished!   You may develop abdominal discomfort or diarrhea from the antibiotic.  You may help offset this with probiotics which you can buy or get in yogurt. Do not eat  or take the probiotics until 2 hours after your antibiotic. If you have any abnormal lab results, you will receive a call from our facility. Follow-up with your primary care or OB/GYN for reevaluation. Return to the ED if any concerning symptoms develop.

## 2017-03-16 LAB — GC/CHLAMYDIA PROBE AMP (~~LOC~~) NOT AT ARMC
Chlamydia: NEGATIVE
Neisseria Gonorrhea: NEGATIVE

## 2017-03-16 LAB — RPR: RPR Ser Ql: NONREACTIVE

## 2017-03-16 LAB — HIV ANTIBODY (ROUTINE TESTING W REFLEX): HIV Screen 4th Generation wRfx: NONREACTIVE

## 2017-03-17 LAB — URINE CULTURE

## 2018-12-09 IMAGING — US US TRANSVAGINAL NON-OB
1 series · 13 of 25 positions shown · non-contrast
Comparison: None

CLINICAL DATA: Pelvic pain.

EXAM:
TRANSABDOMINAL AND TRANSVAGINAL ULTRASOUND OF PELVIS
TECHNIQUE: Both transabdominal and transvaginal ultrasound examinations of the
pelvis were performed. Transabdominal technique was performed for
global imaging of the pelvis including uterus, ovaries, adnexal
regions, and pelvic cul-de-sac. It was necessary to proceed with
endovaginal exam following the transabdominal exam to visualize the
endometrium and ovaries.

[Series 1: us transvaginal non-ob · 0.24mm/px · 120 acquisitions, 13 frames shown]
[im 1/120]
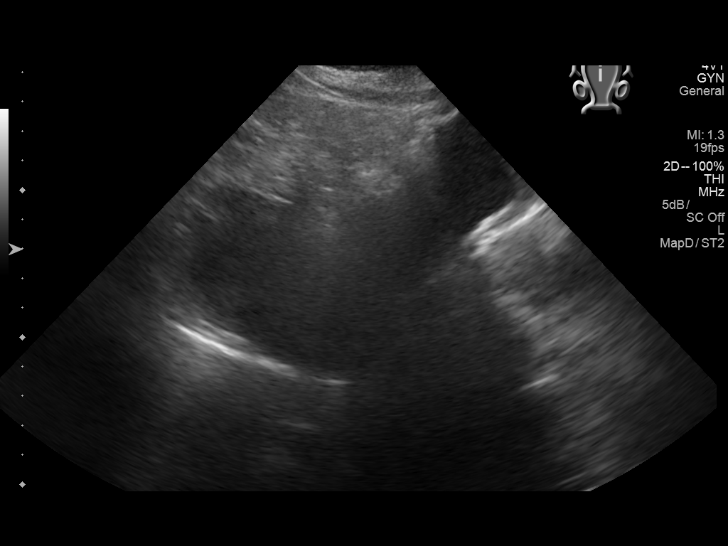
[im 10/120]
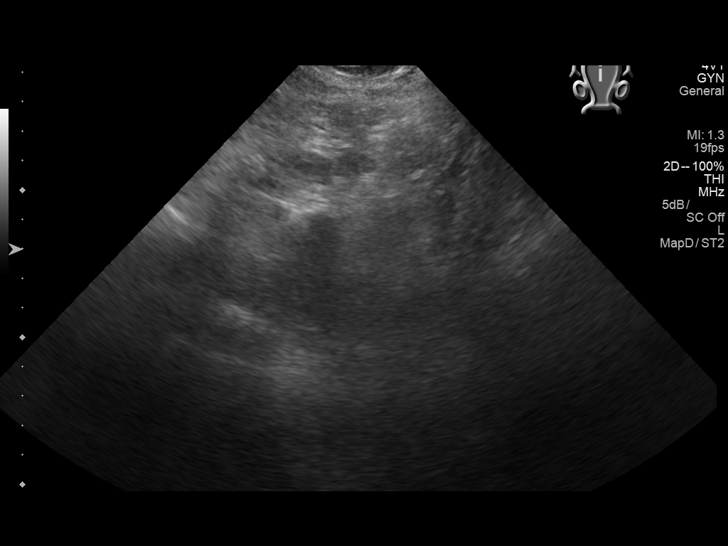
[im 20/120]
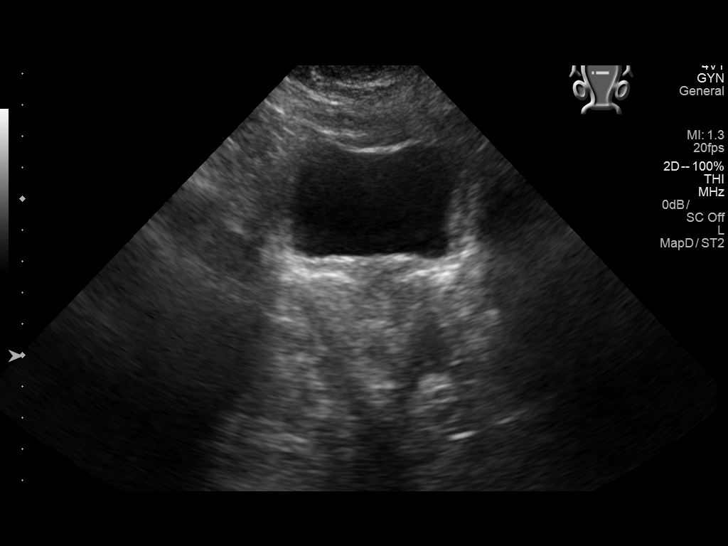
[im 30/120]
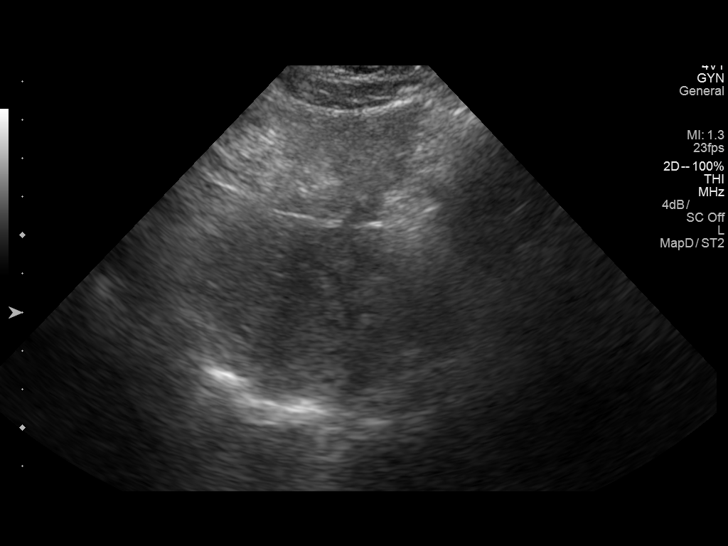
[im 40/120]
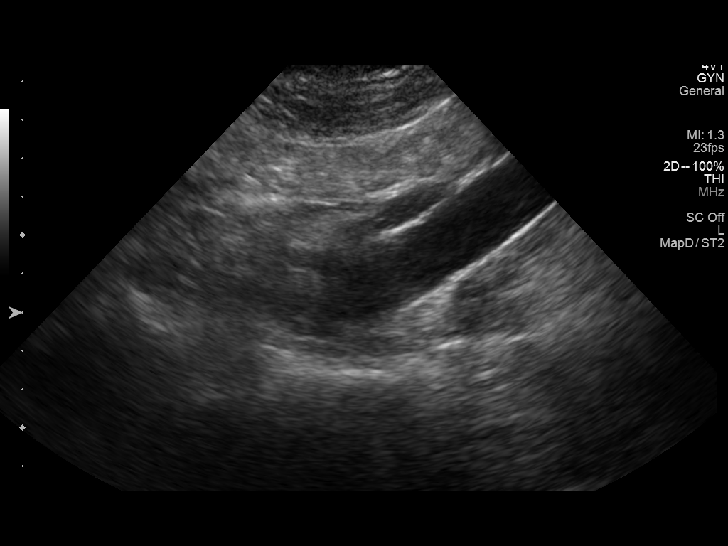
[im 50/120]
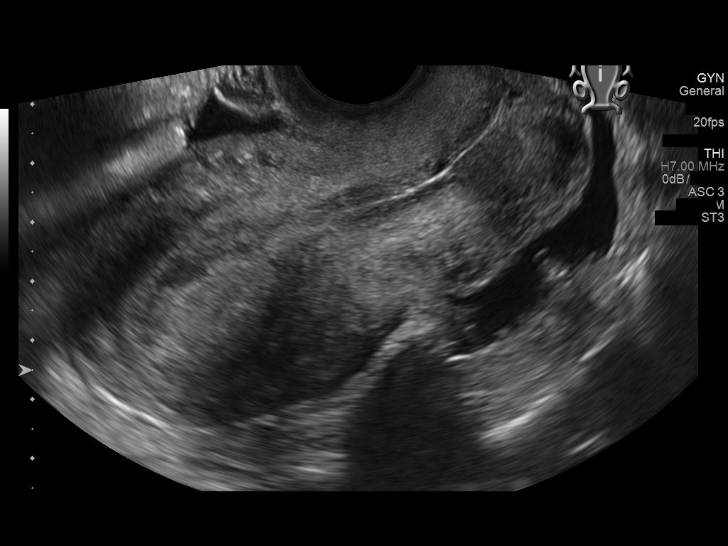
[im 60/120]
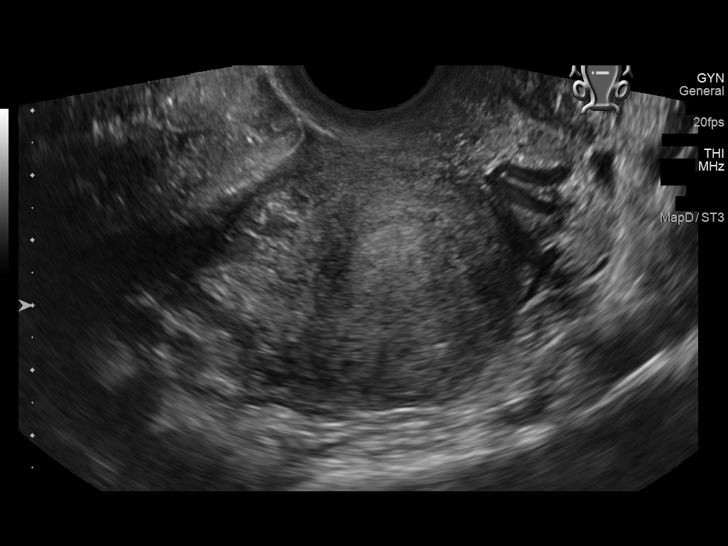
[im 70/120]
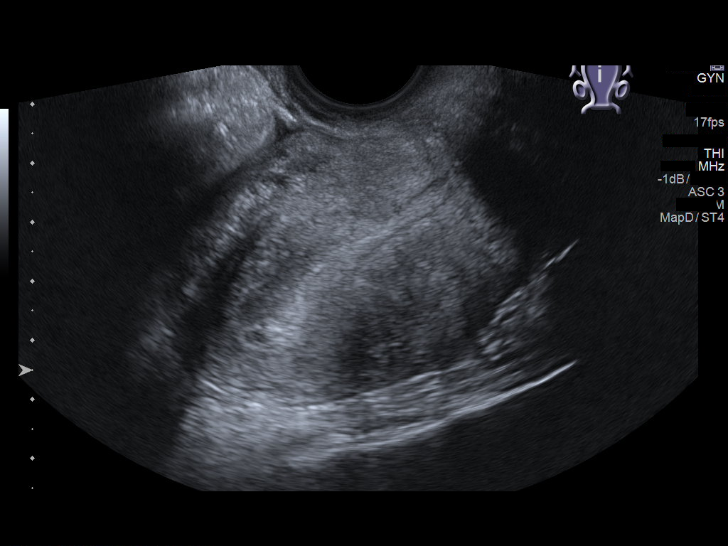
[im 80/120]
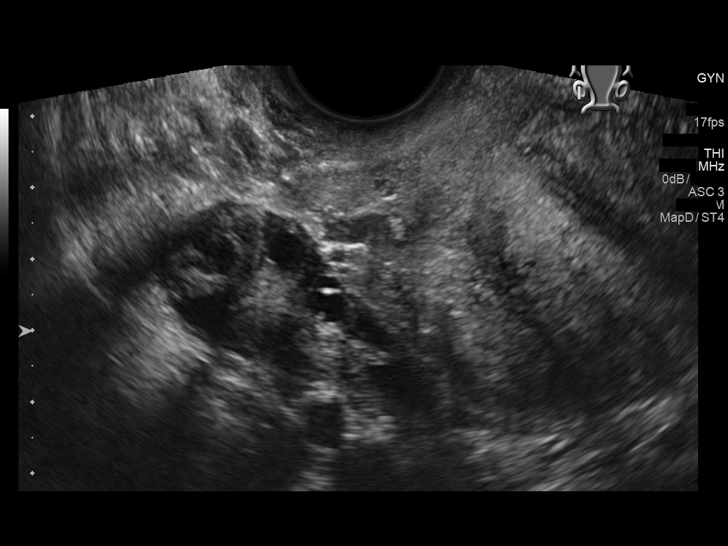
[im 90/120]
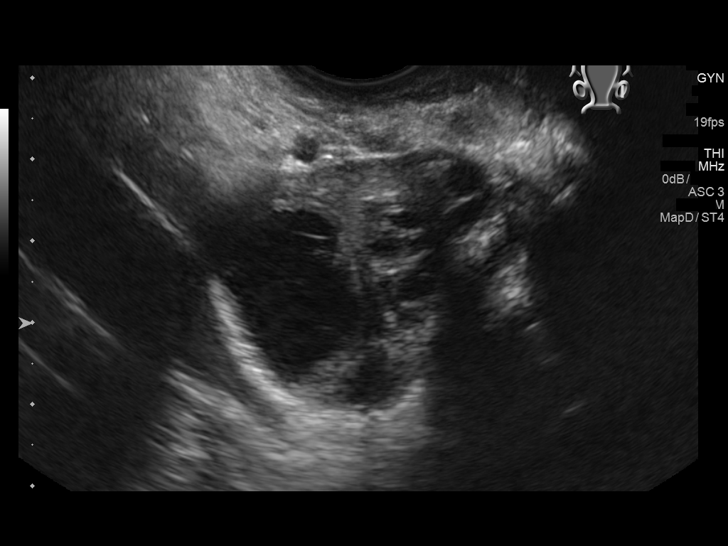
[im 100/120]
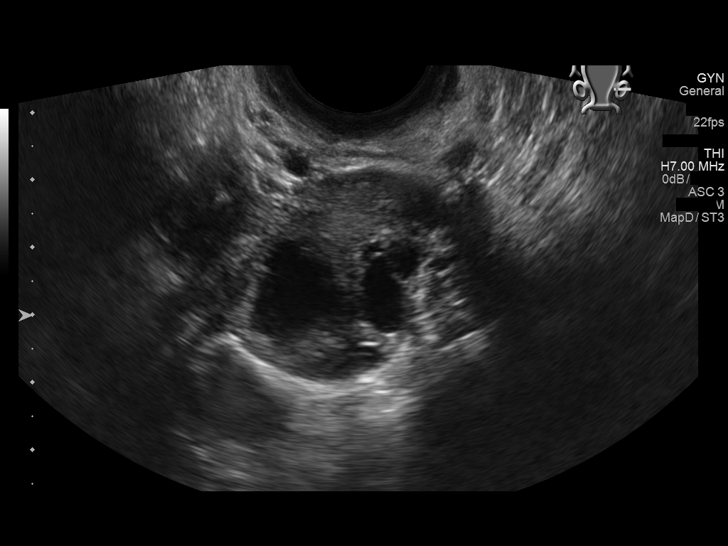
[im 110/120]
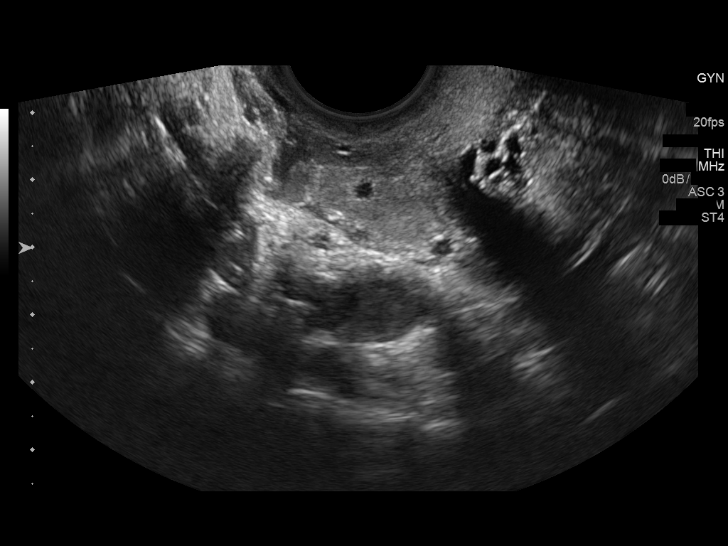
[im 120/120]
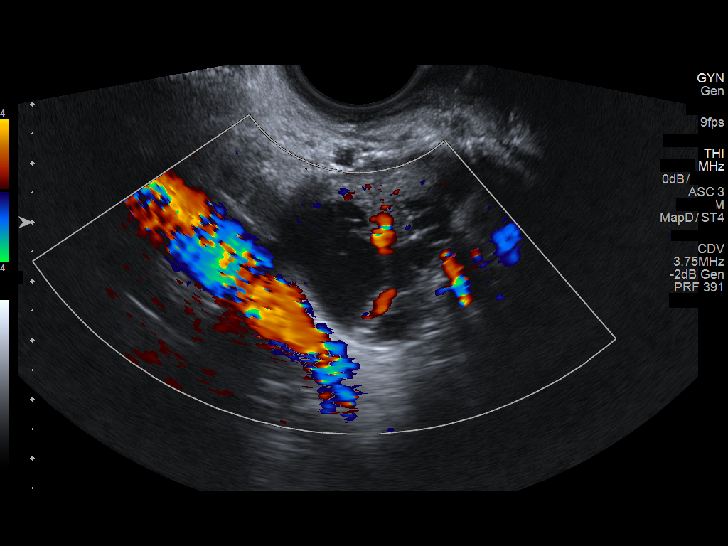

[13 of 25 positions shown; findings below may reference images not displayed]

FINDINGS: Uterus

Measurements: 9.4 x 4.9 x 6.2 cm. The uterine parenchyma is mildly
heterogeneous with no focal mass.

Endometrium

Thickness: 18 mm.  No focal abnormality visualized.

Right ovary

Measurements: 4 x 1.9 x 1.9 cm. Normal appearance/no adnexal mass.

Left ovary

Measurements: 3.9 x 3.4 x 3.2 cm. Contains a 2.2 x 1.4 x 1.8 cm
dominant mildly complicated, possibly hemorrhagic follicle.

Other findings

Mild fluid in the pelvis, likely physiologic.
IMPRESSION: 1. The endometrium is mildly thickened measuring 18 mm. Recommend an
ultrasound in 6-8 weeks, in the week immediately following the
patient's menses, for further evaluation.
2. Probable hemorrhagic follicle in the left ovary.
3. No other abnormalities.

## 2019-03-15 ENCOUNTER — Encounter: Payer: Self-pay | Admitting: Emergency Medicine

## 2019-03-15 ENCOUNTER — Telehealth: Payer: Self-pay | Admitting: Emergency Medicine

## 2019-03-15 ENCOUNTER — Emergency Department
Admission: EM | Admit: 2019-03-15 | Discharge: 2019-03-15 | Disposition: A | Discharge status: Home / Self Care | Payer: Medicaid Other | Attending: Emergency Medicine | Admitting: Emergency Medicine

## 2019-03-15 ENCOUNTER — Other Ambulatory Visit: Payer: Self-pay

## 2019-03-15 DIAGNOSIS — N390 Urinary tract infection, site not specified: Secondary | ICD-10-CM

## 2019-03-15 DIAGNOSIS — N730 Acute parametritis and pelvic cellulitis: Secondary | ICD-10-CM | POA: Diagnosis not present

## 2019-03-15 DIAGNOSIS — Z87891 Personal history of nicotine dependence: Secondary | ICD-10-CM | POA: Insufficient documentation

## 2019-03-15 DIAGNOSIS — M545 Low back pain: Secondary | ICD-10-CM | POA: Diagnosis not present

## 2019-03-15 DIAGNOSIS — R1030 Lower abdominal pain, unspecified: Secondary | ICD-10-CM | POA: Diagnosis present

## 2019-03-15 LAB — URINALYSIS, COMPLETE (UACMP) WITH MICROSCOPIC
Bacteria, UA: NONE SEEN
Bilirubin Urine: NEGATIVE
Glucose, UA: NEGATIVE mg/dL
Ketones, ur: NEGATIVE mg/dL
Nitrite: NEGATIVE
Protein, ur: 30 mg/dL — AB
Specific Gravity, Urine: 1.024 (ref 1.005–1.030)
pH: 6 (ref 5.0–8.0)

## 2019-03-15 LAB — CHLAMYDIA/NGC RT PCR (ARMC ONLY)

## 2019-03-15 LAB — WET PREP, GENITAL
Sperm: NONE SEEN
Yeast Wet Prep HPF POC: NONE SEEN

## 2019-03-15 LAB — CHLAMYDIA/NGC RT PCR (ARMC ONLY)??????????
Chlamydia Tr: DETECTED — AB
N gonorrhoeae: NOT DETECTED

## 2019-03-15 LAB — POCT PREGNANCY, URINE: Preg Test, Ur: NEGATIVE

## 2019-03-15 MED ORDER — METRONIDAZOLE 500 MG PO TABS
2000.0000 mg | ORAL_TABLET | Freq: Once | ORAL | Status: AC
  Administered 2019-03-15: 13:00:00 2000 mg via ORAL
  Filled 2019-03-15: qty 4

## 2019-03-15 MED ORDER — CEFTRIAXONE SODIUM 250 MG IJ SOLR
250.0000 mg | Freq: Once | INTRAMUSCULAR | Status: AC
  Administered 2019-03-15: 13:00:00 250 mg via INTRAMUSCULAR
  Filled 2019-03-15: qty 250

## 2019-03-15 MED ORDER — LIDOCAINE HCL (PF) 1 % IJ SOLN
INTRAMUSCULAR | Status: AC
  Administered 2019-03-15: 0.9 mL
  Filled 2019-03-15: qty 5

## 2019-03-15 MED ORDER — LIDOCAINE HCL (PF) 1 % IJ SOLN
0.9000 mL | Freq: Once | INTRAMUSCULAR | Status: AC
  Administered 2019-03-15: 0.9 mL

## 2019-03-15 MED ORDER — KETOROLAC TROMETHAMINE 60 MG/2ML IM SOLN
60.0000 mg | Freq: Once | INTRAMUSCULAR | Status: AC
  Administered 2019-03-15: 12:00:00 60 mg via INTRAMUSCULAR
  Filled 2019-03-15: qty 2

## 2019-03-15 MED ORDER — DOXYCYCLINE HYCLATE 100 MG PO CAPS: 100.0000 mg | ORAL_CAPSULE | Freq: Two times a day (BID) | ORAL | 0 refills | Status: AC

## 2019-03-15 MED ORDER — AZITHROMYCIN 1 G PO PACK
1.0000 g | PACK | Freq: Once | ORAL | Status: AC
  Administered 2019-03-15: 1 g via ORAL
  Filled 2019-03-15: qty 1

## 2019-03-15 MED ORDER — ONDANSETRON 4 MG PO TBDP: 4.0000 mg | ORAL_TABLET | Freq: Three times a day (TID) | ORAL | 0 refills | Status: DC | PRN

## 2019-03-15 MED ORDER — IBUPROFEN 600 MG PO TABS: 600.0000 mg | ORAL_TABLET | Freq: Three times a day (TID) | ORAL | 0 refills | Status: DC | PRN

## 2019-03-15 NOTE — Telephone Encounter (Addendum)
Called patient to notify of positive chlamydia.  She was treated in the ED.  I left her a message asking her to call me  Patient called me back and I gave her results.  Explained importance of partner treatment

## 2019-03-15 NOTE — ED Provider Notes (Signed)
[facility] Emergency Department Provider Note  ____________________________________________   First MD Initiated Contact with Patient 03/15/19 1025     (approximate)  I have reviewed the triage vital signs and the nursing notes.   HISTORY  Chief Complaint Back Pain and Abdominal Pain    HPI Lynn Lynch is a 28 y.o. female with past medical history as below here with intermittent abdominal pain.  The patient states that for last 2 days, she has had gradual onset of progressively worsening sharp, stabbing, suprapubic pain radiating towards her midline lower back.  Denies any radiation down her legs.  The pain does not necessarily seem to be worse, but is occasionally correlated with urination.  No constipation or diarrhea.  No vaginal discharge.  She did note a small amount of blood after wiping after peeing the other day, but is unsure whether this came from her urethra or vagina.  Her last period was approximately a week ago and was normal.  No history of ovarian cyst.  Pain is not colicky.  No specific alleviating or aggravating factors.  No recent sick contacts.  No fevers or chills.  No flank pain.         Past Medical History:  Diagnosis Date  . Gestational hypertension   . Pregnancy induced hypertension    G1 - Pre-E    Patient Active Problem List   Diagnosis Date Noted  . Sterilization 08/02/2013  . Late prenatal care 04/07/2013  . Chlamydia infection, current pregnancy x 2 episodes 04/07/2013    Past Surgical History:  Procedure Laterality Date  . NO PAST SURGERIES    . TUBAL LIGATION Bilateral 08/02/2013   Procedure: POST PARTUM TUBAL LIGATION;  Surgeon: Willodean Rosenthalarolyn Harraway-Smith, MD;  Location: [facility];  Service: Gynecology;  Laterality: Bilateral;    Prior to Admission medications   Medication Sig Start Date End Date Taking? Authorizing Provider  doxycycline (VIBRAMYCIN) 100 MG capsule Take 1 capsule (100 mg total) by mouth 2 (two)  times daily for 14 days. 03/15/19 03/29/19  Shaune PollackIsaacs, Jackelynn Hosie, MD  ibuprofen (ADVIL) 600 MG tablet Take 1 tablet (600 mg total) by mouth every 8 (eight) hours as needed for moderate pain (back pain). 03/15/19   Shaune PollackIsaacs, Delynn Olvera, MD  ondansetron (ZOFRAN ODT) 4 MG disintegrating tablet Take 1 tablet (4 mg total) by mouth every 8 (eight) hours as needed for nausea or vomiting. 03/15/19   Shaune PollackIsaacs, Jaaliyah Lucatero, MD    Allergies Patient has no known allergies.  Family History  Problem Relation Age of Onset  . Hypertension Mother     Social History Social History   Tobacco Use  . Smoking status: Former Smoker    Last attempt to quit: 10/21/2011    Years since quitting: 7.4  . Smokeless tobacco: Never Used  Substance Use Topics  . Alcohol use: No  . Drug use: No    Review of Systems Constitutional: No fever/chills Eyes: No visual changes. ENT: No sore throat. Cardiovascular: Denies chest pain. Respiratory: Denies shortness of breath. Gastrointestinal: Positive abdominal pain as above   No nausea, no vomiting.  No diarrhea.  No constipation. Genitourinary: Negative for dysuria. Musculoskeletal: Negative for neck pain.  Negative for back pain. Integumentary: Negative for rash. Neurological: Negative for headaches, focal weakness or numbness.   ____________________________________________   PHYSICAL EXAM:  VITAL SIGNS: ED Triage Vitals  Enc Vitals Group     BP 03/15/19 1021 (!) 143/78     Pulse Rate 03/15/19 1021 79  Resp 03/15/19 1021 16     Temp 03/15/19 1021 98 F (36.7 C)     Temp Source 03/15/19 1021 Oral     SpO2 03/15/19 1021 100 %     Weight 03/15/19 1022 230 lb (104.3 kg)     Height 03/15/19 1022  (1.575 m)     Head Circumference --      Peak Flow --      Pain Score 03/15/19 1022 5     Pain Loc --      Pain Edu? --      Excl. in GC? --    Constitutional: Alert and oriented. Well appearing and in no acute distress. Eyes: Conjunctivae are normal.  Head: Atraumatic.  Nose: No congestion/rhinnorhea. Mouth/Throat: Mucous membranes are moist. Neck: No stridor.  No meningeal signs.   Cardiovascular: Normal rate, regular rhythm. Good peripheral circulation. Grossly normal heart sounds. Respiratory: Normal respiratory effort.  No retractions. No audible wheezing. Gastrointestinal: Mild suprapubic tenderness noted.  No rebound or guarding.  No right lower quadrant tenderness.  No distention.  .  Genitourinary: Moderate vaginal discharge.  Positive Musculoskeletal: No lower extremity tenderness nor edema. No gross deformities of extremities. Neurologic:  Normal speech and language. No gross focal neurologic deficits are appreciated.  Skin:  Skin is warm, dry and intact. No rash noted.  ____________________________________________   LABS (all labs ordered are listed, but only abnormal results are displayed)  Labs Reviewed  WET PREP, GENITAL - Abnormal; Notable for the following components:      Result Value   Trich, Wet Prep PRESENT (*)    Clue Cells Wet Prep HPF POC PRESENT (*)    WBC, Wet Prep HPF POC FEW (*)    All other components within normal limits  URINALYSIS, COMPLETE (UACMP) WITH MICROSCOPIC - Abnormal; Notable for the following components:   Color, Urine YELLOW (*)    APPearance CLOUDY (*)    Hgb urine dipstick SMALL (*)    Protein, ur 30 (*)    Leukocytes,Ua LARGE (*)    All other components within normal limits  CHLAMYDIA/NGC RT PCR ([facility] ONLY)  POC URINE PREG, ED  POCT PREGNANCY, URINE   ____________________________________________  EKG  None ____________________________________________  RADIOLOGY All imaging, including plain films, CT scans, and ultrasounds, independently reviewed by me, and interpretations confirmed via formal radiology reads.  ED MD interpretation: Not applicable  Official radiology report(s): No results found.  ____________________________________________   PROCEDURES   Procedure(s)  performed (including Critical Care):  Procedures   ____________________________________________   INITIAL IMPRESSION / MDM / ASSESSMENT AND PLAN / ED COURSE  As part of my medical decision making, I reviewed the following data within the electronic MEDICAL RECORD NUMBER Notes from prior ED visits and [place] Controlled Substance Database      *Melannie Metzner was evaluated in Emergency Department on 03/15/2019 for the symptoms described in the history of present illness. She was evaluated in the context of the global COVID-19 pandemic, which necessitated consideration that the patient might be at risk for infection with the SARS-CoV-2 virus that causes COVID-19. Institutional protocols and algorithms that pertain to the evaluation of patients at risk for COVID-19 are in a state of rapid change based on information released by regulatory bodies including the CDC and federal and state organizations. These policies and algorithms were followed during the patient's care in the ED.  Some ED evaluations and interventions may be delayed as a result of limited staffing during the pandemic.*  Medical Decision Making: 28 year old female here with lower pelvic pain.  Lab work shows possible UTI, though pelvic exam is more consistent with PID and she has positive trichomonas and white blood cells.  She also has positive clue cells but denies any vaginal discharge.  Will treat her for possible STD, and give a longer course of doxycycline which should cover most of her urine as well.  Urine culture sent.  GC/CT sent.  Discussed safe sex precautions.  No adnexal pain or fullness or evidence of TOA or systemic illness.  Urine pregnancy is negative. ____________________________________________  FINAL CLINICAL IMPRESSION(S) / ED DIAGNOSES  Final diagnoses:  PID (acute pelvic inflammatory disease)  Lower urinary tract infectious disease     MEDICATIONS GIVEN DURING THIS VISIT:  Medications  cefTRIAXone  (ROCEPHIN) injection 250 mg (has no administration in time range)  metroNIDAZOLE (FLAGYL) tablet 2,000 mg (has no administration in time range)  azithromycin (ZITHROMAX) powder 1 g (has no administration in time range)  ketorolac (TORADOL) injection 60 mg (60 mg Intramuscular Given 03/15/19 1137)     ED Discharge Orders         Ordered    doxycycline (VIBRAMYCIN) 100 MG capsule  2 times daily     03/15/19 1230    ondansetron (ZOFRAN ODT) 4 MG disintegrating tablet  Every 8 hours PRN     03/15/19 1230    ibuprofen (ADVIL) 600 MG tablet  Every 8 hours PRN     03/15/19 1230           Note:  This document was prepared using Dragon voice recognition software and may include unintentional dictation errors.   Shaune Pollack, MD 03/15/19 1231

## 2019-03-15 NOTE — ED Triage Notes (Signed)
Pt states lower back pain for the past 2 days, sharp lower abd pain as well, not one side more than others. Denies N,V,D. States a "drop" of blood vaginally yesterday however, not her period. Possibly pregnant, LMP 03/08/19.

## 2019-03-15 NOTE — ED Notes (Signed)
Pt sitting in bed speaking with this RN, NAD noted. Pt states when she does have pain it is sharp and a 9/10. No pain at this time.

## 2019-04-27 ENCOUNTER — Emergency Department
Admission: EM | Admit: 2019-04-27 | Discharge: 2019-04-27 | Discharge status: Against Medical Advice | Payer: Medicaid Other

## 2019-04-27 NOTE — ED Notes (Signed)
Called for triage x 3  No response 

## 2019-06-02 ENCOUNTER — Other Ambulatory Visit: Payer: Self-pay

## 2019-06-02 DIAGNOSIS — R109 Unspecified abdominal pain: Secondary | ICD-10-CM | POA: Diagnosis present

## 2019-06-02 DIAGNOSIS — Z5321 Procedure and treatment not carried out due to patient leaving prior to being seen by health care provider: Secondary | ICD-10-CM | POA: Insufficient documentation

## 2019-06-02 LAB — POCT PREGNANCY, URINE: Preg Test, Ur: NEGATIVE

## 2019-06-02 NOTE — ED Triage Notes (Signed)
Patient c/o lower abdominal pain and diarrhea X 2 days.

## 2019-06-03 ENCOUNTER — Emergency Department
Admission: EM | Admit: 2019-06-03 | Discharge: 2019-06-03 | Disposition: A | Discharge status: Home / Self Care | Payer: Medicaid Other | Attending: Emergency Medicine | Admitting: Emergency Medicine

## 2019-06-03 LAB — CBC
HCT: 35.2 % — ABNORMAL LOW (ref 36.0–46.0)
Hemoglobin: 11.7 g/dL — ABNORMAL LOW (ref 12.0–15.0)
MCH: 30.6 pg (ref 26.0–34.0)
MCHC: 33.2 g/dL (ref 30.0–36.0)
MCV: 92.1 fL (ref 80.0–100.0)
Platelets: 328 10*3/uL (ref 150–400)
RBC: 3.82 MIL/uL — ABNORMAL LOW (ref 3.87–5.11)
RDW: 13.7 % (ref 11.5–15.5)
WBC: 6.9 10*3/uL (ref 4.0–10.5)
nRBC: 0 % (ref 0.0–0.2)

## 2019-06-03 LAB — COMPREHENSIVE METABOLIC PANEL
ALT: 14 U/L (ref 0–44)
AST: 20 U/L (ref 15–41)
Albumin: 4 g/dL (ref 3.5–5.0)
Alkaline Phosphatase: 43 U/L (ref 38–126)
Anion gap: 6 (ref 5–15)
BUN: 16 mg/dL (ref 6–20)
CO2: 25 mmol/L (ref 22–32)
Calcium: 9.4 mg/dL (ref 8.9–10.3)
Chloride: 103 mmol/L (ref 98–111)
Creatinine, Ser: 0.7 mg/dL (ref 0.44–1.00)
GFR calc Af Amer: >60 mL/min (ref >60)
GFR calc non Af Amer: >60 mL/min (ref >60)
Glucose, Bld: 93 mg/dL (ref 70–99)
Potassium: 3.8 mmol/L (ref 3.5–5.1)
Sodium: 134 mmol/L — ABNORMAL LOW (ref 135–145)
Total Bilirubin: 0.3 mg/dL (ref 0.3–1.2)
Total Protein: 8 g/dL (ref 6.5–8.1)

## 2019-06-03 LAB — LIPASE, BLOOD: Lipase: 20 U/L (ref 11–51)

## 2019-06-03 NOTE — ED Notes (Signed)
Called, no answer. Not seen in lobby 

## 2019-06-03 NOTE — ED Notes (Signed)
Patient called, no answer. Patient not seen in lobby, bathroom, or outside.  

## 2019-06-03 NOTE — ED Notes (Signed)
Patient called, no answer. Patient not seen in lobby 

## 2019-06-23 ENCOUNTER — Ambulatory Visit: Payer: Self-pay

## 2019-06-26 ENCOUNTER — Encounter: Payer: Self-pay | Admitting: Emergency Medicine

## 2019-06-26 ENCOUNTER — Other Ambulatory Visit: Payer: Self-pay

## 2019-06-26 ENCOUNTER — Emergency Department
Admission: EM | Admit: 2019-06-26 | Discharge: 2019-06-26 | Discharge status: Against Medical Advice | Payer: Medicaid Other | Attending: Emergency Medicine | Admitting: Emergency Medicine

## 2019-06-26 DIAGNOSIS — M549 Dorsalgia, unspecified: Secondary | ICD-10-CM | POA: Diagnosis present

## 2019-06-26 DIAGNOSIS — Z5321 Procedure and treatment not carried out due to patient leaving prior to being seen by health care provider: Secondary | ICD-10-CM | POA: Diagnosis not present

## 2019-06-26 NOTE — ED Notes (Signed)
Pt told [name], registration, that she was leaving to go to another hospital. [name]. Saw pt leave to lobby.

## 2019-06-26 NOTE — ED Triage Notes (Signed)
Pt c/o back pain, states feels tightness in her back.

## 2019-06-28 ENCOUNTER — Emergency Department
Admission: EM | Admit: 2019-06-28 | Discharge: 2019-06-28 | Disposition: A | Discharge status: Home / Self Care | Payer: Medicaid Other | Attending: Emergency Medicine | Admitting: Emergency Medicine

## 2019-06-28 ENCOUNTER — Other Ambulatory Visit: Payer: Self-pay

## 2019-06-28 DIAGNOSIS — N760 Acute vaginitis: Secondary | ICD-10-CM | POA: Insufficient documentation

## 2019-06-28 DIAGNOSIS — M545 Low back pain, unspecified: Secondary | ICD-10-CM

## 2019-06-28 DIAGNOSIS — B9689 Other specified bacterial agents as the cause of diseases classified elsewhere: Secondary | ICD-10-CM

## 2019-06-28 DIAGNOSIS — Z87891 Personal history of nicotine dependence: Secondary | ICD-10-CM | POA: Diagnosis not present

## 2019-06-28 DIAGNOSIS — R300 Dysuria: Secondary | ICD-10-CM | POA: Diagnosis present

## 2019-06-28 LAB — URINALYSIS, COMPLETE (UACMP) WITH MICROSCOPIC
Bacteria, UA: NONE SEEN
Bilirubin Urine: NEGATIVE
Glucose, UA: NEGATIVE mg/dL
Hgb urine dipstick: NEGATIVE
Ketones, ur: NEGATIVE mg/dL
Leukocytes,Ua: NEGATIVE
Nitrite: NEGATIVE
Protein, ur: NEGATIVE mg/dL
Specific Gravity, Urine: 1.026 (ref 1.005–1.030)
pH: 5 (ref 5.0–8.0)

## 2019-06-28 LAB — WET PREP, GENITAL
Sperm: NONE SEEN
Trich, Wet Prep: NONE SEEN
Yeast Wet Prep HPF POC: NONE SEEN

## 2019-06-28 LAB — POCT PREGNANCY, URINE: Preg Test, Ur: NEGATIVE

## 2019-06-28 MED ORDER — METRONIDAZOLE 500 MG PO TABS: 500.0000 mg | ORAL_TABLET | Freq: Two times a day (BID) | ORAL | 0 refills | Status: AC

## 2019-06-28 NOTE — ED Triage Notes (Signed)
pt c/o lower back and neck pain for the past 3 days, denies injury, states she was here the other days but LWBS

## 2019-06-28 NOTE — ED Notes (Signed)
See triage note presents with lower back pain which is moving to lower abd  Pain started couple of days ago  Denies any injury  Ambulates well

## 2019-06-28 NOTE — Discharge Instructions (Addendum)
Your exam and labs are consistent with bacterial vaginosis (BV). This is not considered a sexually-transmitted infection, and your partner does not need treatment. Take the antibiotic as directed. Follow-up with the [facility] Department for further evaluation. Return as needed.

## 2019-06-28 NOTE — ED Provider Notes (Signed)
[facility] Emergency Department Provider Note ____________________________________________  Time seen: 1147  I have reviewed the triage vital signs and the nursing notes.  HISTORY  Chief Complaint  Back Pain  HPI Lynn Lynch is a 28 y.o. female presents with self to the ED for evaluation of bilateral flank pain.  Patient is also reporting some mild dysuria and vaginal discharge patient denies abnormal vaginal bleeding, fever, chills, sweats.  She also denies any pelvic pain, abdominal pain, or vomiting.  She has had previous UTIs, and is concerned for that primarily.   Past Medical History:  Diagnosis Date  . Gestational hypertension   . Pregnancy induced hypertension    G1 - Pre-E    Patient Active Problem List   Diagnosis Date Noted  . Sterilization 08/02/2013  . Late prenatal care 04/07/2013  . Chlamydia infection, current pregnancy x 2 episodes 04/07/2013    Past Surgical History:  Procedure Laterality Date  . NO PAST SURGERIES    . TUBAL LIGATION Bilateral 08/02/2013   Procedure: POST PARTUM TUBAL LIGATION;  Surgeon: Willodean Rosenthalarolyn Harraway-Smith, MD;  Location: [facility];  Service: Gynecology;  Laterality: Bilateral;    Prior to Admission medications   Medication Sig Start Date End Date Taking? Authorizing Provider  metroNIDAZOLE (FLAGYL) 500 MG tablet Take 1 tablet (500 mg total) by mouth 2 (two) times daily for 7 days. 06/28/19 07/05/19  Myson Levi, Charlesetta IvoryJenise V Bacon, PA-C    Allergies Patient has no known allergies.  Family History  Problem Relation Age of Onset  . Hypertension Mother     Social History Social History   Tobacco Use  . Smoking status: Former Smoker    Quit date: 10/21/2011    Years since quitting: 7.6  . Smokeless tobacco: Never Used  Substance Use Topics  . Alcohol use: No  . Drug use: No    Review of Systems  Constitutional: Negative for fever. Cardiovascular: Negative for chest pain. Respiratory: Negative for  shortness of breath. Gastrointestinal: Negative for abdominal pain, vomiting and diarrhea. Genitourinary: Positive for dysuria and vaginal irritation. Musculoskeletal: Negative for back pain. Skin: Negative for rash. Neurological: Negative for headaches, focal weakness or numbness. ____________________________________________  PHYSICAL EXAM:  VITAL SIGNS: ED Triage Vitals  Enc Vitals Group     BP 06/28/19 1112 (!) 124/59     Pulse Rate 06/28/19 1112 63     Resp 06/28/19 1112 18     Temp 06/28/19 1112 98.4 F (36.9 C)     Temp Source 06/28/19 1112 Oral     SpO2 06/28/19 1112 100 %     Weight 06/28/19 1114 280 lb (127 kg)     Height 06/28/19 1114 5\' 1"  (1.549 m)     Head Circumference --      Peak Flow --      Pain Score 06/28/19 1114 6     Pain Loc --      Pain Edu? --      Excl. in GC? --     Constitutional: Alert and oriented. Well appearing and in no distress. Head: Normocephalic and atraumatic. Eyes: Conjunctivae are normal. Normal extraocular movements Cardiovascular: Normal rate, regular rhythm. Normal distal pulses. Respiratory: Normal respiratory effort. No wheezes/rales/rhonchi. Gastrointestinal: Soft and nontender. No distention.  Mild CVA tenderness bilaterally. GU: Deferred. Patient-collected wet prep submitted to lab Musculoskeletal: Normal spinal alignment without midline tenderness, spasm, deformity, or step-off.  Nontender with normal range of motion in all extremities.  Neurologic:  Normal gait without ataxia. Normal speech  and language. No gross focal neurologic deficits are appreciated. Skin:  Skin is warm, dry and intact. No rash noted. ____________________________________________   LABS (pertinent positives/negatives) Labs Reviewed  WET PREP, GENITAL - Abnormal; Notable for the following components:      Result Value   Clue Cells Wet Prep HPF POC PRESENT (*)    WBC, Wet Prep HPF POC MODERATE (*)    All other components within normal limits   URINALYSIS, COMPLETE (UACMP) WITH MICROSCOPIC - Abnormal; Notable for the following components:   Color, Urine YELLOW (*)    APPearance HAZY (*)    All other components within normal limits  POC URINE PREG, ED  POCT PREGNANCY, URINE  ____________________________________________  PROCEDURES  Procedures ____________________________________________  INITIAL IMPRESSION / ASSESSMENT AND PLAN / ED COURSE  Patient with ED evaluation of bilateral flank pain as well as some vaginal irritation.  Patient's urinalysis does not reveal an acute cystitis at this time.  Her wet prep does confirm clue cells consistent with BV patient be treated empirically with metronidazole as directed.  She is also encouraged to continue to monitor and treat back pain with over-the-counter Tylenol or Motrin.  She is referred to the [facility] for further STI testing.  Return precautions have been reviewed.  [name] was evaluated in Emergency Department on 06/28/2019 for the symptoms described in the history of present illness. She was evaluated in the context of the global COVID-19 pandemic, which necessitated consideration that the patient might be at risk for infection with the SARS-CoV-2 virus that causes COVID-19. Institutional protocols and algorithms that pertain to the evaluation of patients at risk for COVID-19 are in a state of rapid change based on information released by regulatory bodies including the CDC and federal and state organizations. These policies and algorithms were followed during the patient's care in the ED. ____________________________________________  FINAL CLINICAL IMPRESSION(S) / ED DIAGNOSES  Final diagnoses:  BV (bacterial vaginosis)  Acute bilateral low back pain without sciatica      Melvenia Needles, PA-C 06/28/19 1623    Blake Divine, MD 06/29/19 [phone]

## 2019-06-30 ENCOUNTER — Other Ambulatory Visit: Payer: Self-pay

## 2019-06-30 ENCOUNTER — Ambulatory Visit: Payer: Medicaid Other | Admitting: Advanced Practice Midwife

## 2019-06-30 ENCOUNTER — Encounter: Payer: Self-pay | Admitting: Advanced Practice Midwife

## 2019-06-30 DIAGNOSIS — Z113 Encounter for screening for infections with a predominantly sexual mode of transmission: Secondary | ICD-10-CM

## 2019-06-30 LAB — WET PREP FOR TRICH, YEAST, CLUE
Clue Cell Exam: POSITIVE — AB
Trichomonas Exam: NEGATIVE

## 2019-06-30 NOTE — Progress Notes (Signed)
Wet mount reviewed, no tx per standing order. Provider orders completed. 

## 2019-06-30 NOTE — Progress Notes (Signed)
    STI clinic/screening visit  Subjective:  Lynn Lynch is a 28 y.o.G3P2 SBF female being seen today for an STI screening visit. The patient reports they do not have symptoms.  Patient has the following medical conditions:   Patient Active Problem List   Diagnosis Date Noted  . Sterilization 08/02/2013  . Late prenatal care 04/07/2013  . Chlamydia infection, current pregnancy x 2 episodes 04/07/2013     Chief Complaint  Patient presents with  . SEXUALLY TRANSMITTED DISEASE    HPI  Patient reports no symptoms.  Had BTL  See flowsheet for further details and programmatic requirements.    The following portions of the patient's history were reviewed and updated as appropriate: allergies, current medications, past medical history, past social history, past surgical history and problem list.  Objective:  There were no vitals filed for this visit.  Physical Exam Constitutional:      Appearance: She is obese.  HENT:     Head: Atraumatic.     Nose: Nose normal.     Mouth/Throat:     Mouth: Mucous membranes are moist.  Eyes:     Conjunctiva/sclera: Conjunctivae normal.  Neck:     Musculoskeletal: Neck supple.  Pulmonary:     Effort: Pulmonary effort is normal.     Breath sounds: Normal breath sounds.  Abdominal:     Palpations: Abdomen is soft.     Comments: Poor tone, increased adipose, soft without tenderness  Genitourinary:    General: Normal vulva.     Pubic Area: No rash or pubic lice.      Labia:        Right: No lesion.        Left: No lesion.      Vagina: Vaginal discharge (white creamy, ph<4.5) present.     Cervix: Normal.     Uterus: Normal.      Adnexa: Right adnexa normal and left adnexa normal.     Rectum: Normal.  Lymphadenopathy:     Lower Body: No right inguinal adenopathy. No left inguinal adenopathy.  Skin:    General: Skin is warm and dry.  Neurological:     Mental Status: She is alert.  Psychiatric:        Mood and Affect: Mood normal.         Behavior: Behavior normal.       Assessment and Plan:  [name] is a 28 y.o. female presenting to the [facility] Department for STI screening  1. Screening examination for venereal disease Treat wet mount per standing orders - WET PREP FOR TRICH, YEAST, CLUE - Chlamydia/Gonorrhea [place] [facility] - Syphilis Serology, [place] [facility] - HIV [place] [facility]     Return if symptoms worsen or fail to improve.  No future appointments.  Herbie Saxon, CNM

## 2019-07-05 ENCOUNTER — Telehealth: Payer: Self-pay | Admitting: Family Medicine

## 2019-07-05 NOTE — Telephone Encounter (Signed)
TC to patient.  BF results from [facility] +Chlamydia and she would like tx now. RN checked on patient's results from last visit and they are still pending. Appt scheduled for tx tomorrow. Aileen Fass, RN

## 2019-07-05 NOTE — Telephone Encounter (Signed)
Patient said her boyfriend was given meds for chlamydia. She wants meds too but test has not come back positive yet

## 2019-07-06 ENCOUNTER — Ambulatory Visit: Payer: Self-pay

## 2019-07-08 ENCOUNTER — Ambulatory Visit: Payer: Medicaid Other

## 2019-07-08 ENCOUNTER — Telehealth: Payer: Self-pay

## 2019-07-08 ENCOUNTER — Other Ambulatory Visit: Payer: Self-pay

## 2019-07-08 ENCOUNTER — Telehealth: Payer: Self-pay | Admitting: Family Medicine

## 2019-07-08 DIAGNOSIS — A5602 Chlamydial vulvovaginitis: Secondary | ICD-10-CM

## 2019-07-08 MED ORDER — AZITHROMYCIN 500 MG PO TABS
500.0000 mg | ORAL_TABLET | Freq: Once | ORAL | Status: AC
  Administered 2019-07-08: 14:00:00 1000 mg via ORAL

## 2019-07-08 NOTE — Telephone Encounter (Signed)
TC to patient. Verified ID via password/SS#. Informed of positive chlamydia and need for tx. Instructed to eat before visit and have partner call for tx appt. Appt scheduled for today. Wilson Dusenbery, RN    

## 2019-07-08 NOTE — Progress Notes (Signed)
Per client, partner treated last week and no sex with him since he was treated (partner card not given today). Client requested copy of chlamydia results and provided per [facility] policy. ROI sent for scanning. Rich Number, RN

## 2019-09-14 ENCOUNTER — Other Ambulatory Visit: Payer: Self-pay

## 2019-09-14 ENCOUNTER — Encounter: Payer: Self-pay | Admitting: Emergency Medicine

## 2019-09-14 ENCOUNTER — Emergency Department
Admission: EM | Admit: 2019-09-14 | Discharge: 2019-09-14 | Disposition: A | Discharge status: Home / Self Care | Payer: Medicaid Other | Attending: Emergency Medicine | Admitting: Emergency Medicine

## 2019-09-14 DIAGNOSIS — Z5321 Procedure and treatment not carried out due to patient leaving prior to being seen by health care provider: Secondary | ICD-10-CM | POA: Insufficient documentation

## 2019-09-14 DIAGNOSIS — R109 Unspecified abdominal pain: Secondary | ICD-10-CM | POA: Diagnosis not present

## 2019-09-14 LAB — URINALYSIS, COMPLETE (UACMP) WITH MICROSCOPIC
Bilirubin Urine: NEGATIVE
Glucose, UA: NEGATIVE mg/dL
Ketones, ur: NEGATIVE mg/dL
Nitrite: NEGATIVE
Protein, ur: 30 mg/dL — AB
Specific Gravity, Urine: 1.026 (ref 1.005–1.030)
pH: 5 (ref 5.0–8.0)

## 2019-09-14 LAB — POCT PREGNANCY, URINE: Preg Test, Ur: NEGATIVE

## 2019-09-14 NOTE — ED Notes (Signed)
Not in lobby or outside.

## 2019-09-14 NOTE — ED Notes (Signed)
Called patient and she has left to take care o f kids.  She does have pcp. I told her she can call her pcp or return here, and definitley needs to return if she feels sicker.  She says she will try her pcp or may return in am.

## 2019-09-14 NOTE — ED Notes (Signed)
Not in lobby

## 2019-09-14 NOTE — ED Triage Notes (Signed)
Right back/right flank pain radiating around flank x2 days. Urinary frequency.

## 2019-10-11 ENCOUNTER — Encounter: Payer: Self-pay | Admitting: Family Medicine

## 2019-10-11 ENCOUNTER — Ambulatory Visit: Payer: Medicaid Other | Admitting: Family Medicine

## 2019-10-11 ENCOUNTER — Other Ambulatory Visit: Payer: Self-pay

## 2019-10-11 VITALS — BP 106/76 | Temp 97.5°F | Wt 267.0 lb

## 2019-10-11 DIAGNOSIS — N730 Acute parametritis and pelvic cellulitis: Secondary | ICD-10-CM

## 2019-10-11 DIAGNOSIS — Z113 Encounter for screening for infections with a predominantly sexual mode of transmission: Secondary | ICD-10-CM | POA: Diagnosis not present

## 2019-10-11 LAB — WET PREP FOR TRICH, YEAST, CLUE
Trichomonas Exam: NEGATIVE
Yeast Exam: NEGATIVE

## 2019-10-11 MED ORDER — CEFTRIAXONE SODIUM 250 MG IJ SOLR
250.0000 mg | Freq: Once | INTRAMUSCULAR | Status: AC
  Administered 2019-10-11: 17:00:00 250 mg via INTRAMUSCULAR

## 2019-10-11 MED ORDER — DOXYCYCLINE HYCLATE 100 MG PO TABS: 100.0000 mg | ORAL_TABLET | Freq: Two times a day (BID) | ORAL | 0 refills | Status: AC

## 2019-10-11 NOTE — Progress Notes (Signed)
[facility] Department STI clinic/screening visit  Subjective:  Lynn Lynch is a 28 y.o. female being seen today for  Chief Complaint  Patient presents with  . SEXUALLY TRANSMITTED DISEASE     The patient reports they do have symptoms. Patient reports that they do not desire a pregnancy in the next year. They reported they are not interested in discussing contraception today.   Patient has the following medical conditions:   Patient Active Problem List   Diagnosis Date Noted  . Sterilization 08/02/2013  . Late prenatal care 04/07/2013  . Chlamydia infection, current pregnancy x 2 episodes 04/07/2013    HPI  Pt reports she would like STI testing, has had vaginal d/c x1 wk. Uses condoms sometimes. Denies other symptoms.   See flowsheet for further details and programmatic requirements.    Patient's last menstrual period was 10/05/2019 (exact date). BCM: BTL Desires EC? n/a  No components found for: HCV  The following portions of the patient's history were reviewed and updated as appropriate: allergies, current medications, past medical history, past social history, past surgical history and problem list.  Objective:   Vitals:   10/11/19 1714  BP: 106/76  Temp: (!) 97.5 F (36.4 C)  Weight: 267 lb (121.1 kg)    Physical Exam Vitals and nursing note reviewed.  Constitutional:      Appearance: Normal appearance.  HENT:     Head: Normocephalic and atraumatic.     Mouth/Throat:     Mouth: Mucous membranes are moist.     Pharynx: Oropharynx is clear. No oropharyngeal exudate or posterior oropharyngeal erythema.  Pulmonary:     Effort: Pulmonary effort is normal.  Abdominal:     General: Abdomen is flat.     Palpations: There is no mass.     Tenderness: There is no abdominal tenderness. There is no rebound.  Genitourinary:    General: Normal vulva.     Exam position: Lithotomy position.     Pubic Area: No rash or pubic lice.      Labia:      Right: No rash or lesion.        Left: No rash or lesion.      Vagina: Vaginal discharge (white, creamy, ph>4.5) present. No erythema, bleeding or lesions.     Cervix: Cervical motion tenderness and discharge present. No friability, lesion or erythema.     Uterus: Normal.      Adnexa:        Right: Tenderness present. No fullness.         Left: Tenderness present. No fullness.       Rectum: Normal.  Lymphadenopathy:     Head:     Right side of head: No preauricular or posterior auricular adenopathy.     Left side of head: No preauricular or posterior auricular adenopathy.     Cervical: No cervical adenopathy.     Upper Body:     Right upper body: No supraclavicular or axillary adenopathy.     Left upper body: No supraclavicular or axillary adenopathy.     Lower Body: No right inguinal adenopathy. No left inguinal adenopathy.  Skin:    General: Skin is warm and dry.     Findings: No rash.  Neurological:     Mental Status: She is alert and oriented to person, place, and time.      Assessment and Plan:  Lynn Lynch is a 28 y.o. female presenting to the [facility] Department for STI  screening  1. Screening examination for venereal disease -Screenings today as below. Treat wet prep per standing order. -Patient does not meet criteria for HepB, HepC Screening.  -Counseled on warning s/sx and when to seek care. Recommended condom use with all sex and discussed importance of condom use for STI prevention. - HIV [place] [facility] - Syphilis Serology, [place] [facility] - Chlamydia/Gonorrhea [place] [facility] - WET PREP FOR TRICH, YEAST, CLUE  2. PID (acute pelvic inflammatory disease) -Treat for PID today.  -Pt is well appearing, vitals stable, able to come back in 2 days (clinic closed in 3 days for New Years), deemed appropriate for outpatient treatment.  -Advised if pain or fever develops to go to ER.  -Discussed need for rescreen in 3 months.  -Contact card for partner given by  RN. - cefTRIAXone (ROCEPHIN) injection 250 mg - doxycycline (VIBRA-TABS) 100 MG tablet; Take 1 tablet (100 mg total) by mouth 2 (two) times daily for 14 days.  Dispense: 28 tablet; Refill: 0     Return in about 2 days (around 10/13/2019).  Future Appointments  Date Time Provider Department Center  10/13/2019  1:00 PM [facility] PROVIDER [facility] None    Ann Held, PA-C

## 2019-10-11 NOTE — Progress Notes (Signed)
[employer] results reviewed. Per standing orders no treatment indicated. Patient treated for PID per provider orders. Follow up appt scheduled for 10/13/2019 @ 1:00. Hal Morales, RN

## 2019-10-11 NOTE — Progress Notes (Signed)
Here today for STD screening. Accepts bloodwork. Baily Hovanec, RN ° °

## 2019-10-13 ENCOUNTER — Ambulatory Visit: Payer: Medicaid Other | Admitting: Physician Assistant

## 2019-10-13 ENCOUNTER — Other Ambulatory Visit: Payer: Self-pay

## 2019-10-13 ENCOUNTER — Encounter: Payer: Self-pay | Admitting: Physician Assistant

## 2019-10-13 DIAGNOSIS — Z113 Encounter for screening for infections with a predominantly sexual mode of transmission: Secondary | ICD-10-CM | POA: Diagnosis not present

## 2019-10-13 DIAGNOSIS — Z09 Encounter for follow-up examination after completed treatment for conditions other than malignant neoplasm: Secondary | ICD-10-CM

## 2019-10-13 DIAGNOSIS — N739 Female pelvic inflammatory disease, unspecified: Secondary | ICD-10-CM

## 2019-10-13 NOTE — Progress Notes (Signed)
Pt here for PID recheck. Pt reports she is still taking her medication and is feeling better.Ronny Bacon, RN

## 2019-10-13 NOTE — Progress Notes (Signed)
S:  Patient RTC for PID recheck.  States that she is feeling a lot better and tolerating medicine with some nausea but no vomiting.  Denies any new symptoms or concerns. O:  WDWN female in NAD, A&O x3, pleasant and cooperative; abdomen=soft, nt, no masses, no guarding or rebound; bimanual= no CMT, uterus firm, mobile and nt, no adnexal tenderness or fullness.  Test results still pending from exam on 10/11/2019. A/P:  1.  PID resolving with treatment. 2.  Counseled patient to complete all medicines as directed and make sure partner is treated as a contact. 3.  No sex until after medicine is completed and partner has completed his treatment. 4.  Rec condoms with all sex 5.  Call with any questions or concerns. 6.  Counseled that RN will call her if any of her results come back positive, even though she has been treated to cover both GC and Chlamydia.

## 2020-02-08 ENCOUNTER — Emergency Department
Admission: EM | Admit: 2020-02-08 | Discharge: 2020-02-08 | Disposition: A | Discharge status: Home / Self Care | Payer: Medicaid Other | Attending: Emergency Medicine | Admitting: Emergency Medicine

## 2020-02-08 ENCOUNTER — Other Ambulatory Visit: Payer: Self-pay

## 2020-02-08 DIAGNOSIS — R102 Pelvic and perineal pain: Secondary | ICD-10-CM | POA: Diagnosis not present

## 2020-02-08 DIAGNOSIS — Z87891 Personal history of nicotine dependence: Secondary | ICD-10-CM | POA: Diagnosis not present

## 2020-02-08 DIAGNOSIS — I10 Essential (primary) hypertension: Secondary | ICD-10-CM | POA: Insufficient documentation

## 2020-02-08 DIAGNOSIS — N926 Irregular menstruation, unspecified: Secondary | ICD-10-CM | POA: Diagnosis not present

## 2020-02-08 DIAGNOSIS — R109 Unspecified abdominal pain: Secondary | ICD-10-CM | POA: Diagnosis present

## 2020-02-08 LAB — POCT PREGNANCY, URINE: Preg Test, Ur: NEGATIVE

## 2020-02-08 LAB — URINALYSIS, COMPLETE (UACMP) WITH MICROSCOPIC
Bacteria, UA: NONE SEEN
Bilirubin Urine: NEGATIVE
Glucose, UA: NEGATIVE mg/dL
Ketones, ur: NEGATIVE mg/dL
Nitrite: NEGATIVE
Protein, ur: NEGATIVE mg/dL
RBC / HPF: >50 RBC/hpf — ABNORMAL HIGH (ref 0–5)
Specific Gravity, Urine: 1.021 (ref 1.005–1.030)
pH: 6 (ref 5.0–8.0)

## 2020-02-08 LAB — COMPREHENSIVE METABOLIC PANEL
ALT: 13 U/L (ref 0–44)
AST: 18 U/L (ref 15–41)
Albumin: 3.8 g/dL (ref 3.5–5.0)
Alkaline Phosphatase: 39 U/L (ref 38–126)
Anion gap: 5 (ref 5–15)
BUN: 16 mg/dL (ref 6–20)
CO2: 28 mmol/L (ref 22–32)
Calcium: 9.2 mg/dL (ref 8.9–10.3)
Chloride: 103 mmol/L (ref 98–111)
Creatinine, Ser: 0.79 mg/dL (ref 0.44–1.00)
GFR calc Af Amer: >60 mL/min (ref >60)
GFR calc non Af Amer: >60 mL/min (ref >60)
Glucose, Bld: 96 mg/dL (ref 70–99)
Potassium: 3.9 mmol/L (ref 3.5–5.1)
Sodium: 136 mmol/L (ref 135–145)
Total Bilirubin: 0.4 mg/dL (ref 0.3–1.2)
Total Protein: 7.7 g/dL (ref 6.5–8.1)

## 2020-02-08 LAB — CBC
HCT: 32.2 % — ABNORMAL LOW (ref 36.0–46.0)
Hemoglobin: 10.8 g/dL — ABNORMAL LOW (ref 12.0–15.0)
MCH: 30.9 pg (ref 26.0–34.0)
MCHC: 33.5 g/dL (ref 30.0–36.0)
MCV: 92 fL (ref 80.0–100.0)
Platelets: 294 10*3/uL (ref 150–400)
RBC: 3.5 MIL/uL — ABNORMAL LOW (ref 3.87–5.11)
RDW: 13.4 % (ref 11.5–15.5)
WBC: 4.5 10*3/uL (ref 4.0–10.5)
nRBC: 0 % (ref 0.0–0.2)

## 2020-02-08 LAB — LIPASE, BLOOD: Lipase: 21 U/L (ref 11–51)

## 2020-02-08 MED ORDER — SODIUM CHLORIDE 0.9% FLUSH: 3.0000 mL | Freq: Once | INTRAVENOUS | Status: DC

## 2020-02-08 MED ORDER — KETOROLAC TROMETHAMINE 30 MG/ML IJ SOLN: 30.0000 mg | Freq: Once | INTRAMUSCULAR | Status: DC

## 2020-02-08 MED ORDER — ACETAMINOPHEN 500 MG PO TABS: 1000.0000 mg | ORAL_TABLET | Freq: Once | ORAL | Status: DC

## 2020-02-08 NOTE — ED Provider Notes (Signed)
[facility] Emergency Department Provider Note  ____________________________________________   First MD Initiated Contact with Patient 02/08/20 1726     (approximate)  I have reviewed the triage vital signs and the nursing notes.   HISTORY  Chief Complaint Abdominal Pain and Back Pain    HPI Lynn Lynch is a 29 y.o. female with prior pregnancy who comes in with lower abdominal back pain.  Patient states that she last had her period 2 weeks ago.  She is then started having light period again yesterday that is intermittent, mild, nothing makes it better, nothing makes it worse.  She does have some associated bilateral lower abdominal cramping and bilateral lower back cramping.  Denies it being situated to one side, having any vomiting or fevers.  Denies having irregular periods in the past.  She is not on any birth control.  She has had 2 prior pregnancies.  Has a tubal ligation.  Denies any concerns for STDs or vaginal discharge.          Past Medical History:  Diagnosis Date  . Gestational hypertension   . Pregnancy induced hypertension    G1 - Pre-E    Patient Active Problem List   Diagnosis Date Noted  . Sterilization 08/02/2013  . Late prenatal care 04/07/2013  . Chlamydia infection, current pregnancy x 2 episodes 04/07/2013    Past Surgical History:  Procedure Laterality Date  . NO PAST SURGERIES    . TUBAL LIGATION Bilateral 08/02/2013   Procedure: POST PARTUM TUBAL LIGATION;  Surgeon: Willodean Rosenthal, MD;  Location: [facility];  Service: Gynecology;  Laterality: Bilateral;    Prior to Admission medications   Not on File    Allergies Patient has no known allergies.  Family History  Problem Relation Age of Onset  . Hypertension Mother     Social History Social History   Tobacco Use  . Smoking status: Former Smoker    Quit date: 10/21/2011    Years since quitting: 8.3  . Smokeless tobacco: Never Used  Substance Use  Topics  . Alcohol use: No  . Drug use: No      Review of Systems Constitutional: No fever/chills Eyes: No visual changes. ENT: No sore throat. Cardiovascular: Denies chest pain. Respiratory: Denies shortness of breath. Gastrointestinal: No abdominal pain.  No nausea, no vomiting.  No diarrhea.  No constipation. Genitourinary: Negative for dysuria.  Vaginal bleeding Musculoskeletal: Negative for back pain. Skin: Negative for rash. Neurological: Negative for headaches, focal weakness or numbness. All other ROS negative ____________________________________________   PHYSICAL EXAM:  VITAL SIGNS: ED Triage Vitals  Enc Vitals Group     BP 02/08/20 1638 137/81     Pulse Rate 02/08/20 1638 84     Resp 02/08/20 1638 16     Temp 02/08/20 1638 98.4 F (36.9 C)     Temp Source 02/08/20 1638 Oral     SpO2 02/08/20 1638 100 %     Weight 02/08/20 1639 206 lb (93.4 kg)     Height 02/08/20 1639 5\' 1"  (1.549 m)     Head Circumference --      Peak Flow --      Pain Score 02/08/20 1639 8     Pain Loc --      Pain Edu? --      Excl. in GC? --     Constitutional: Alert and oriented. Well appearing and in no acute distress. Eyes: Conjunctivae are normal. EOMI. Head: Atraumatic. Nose: No congestion/rhinnorhea.  Mouth/Throat: Mucous membranes are moist.   Neck: No stridor. Trachea Midline. FROM Cardiovascular: Normal rate, regular rhythm. Grossly normal heart sounds.  Good peripheral circulation. Respiratory: Normal respiratory effort.  No retractions. Lungs CTAB. Gastrointestinal: Soft and nontender. No distention. No abdominal bruits.  Musculoskeletal: No lower extremity tenderness nor edema.  No joint effusions. Neurologic:  Normal speech and language. No gross focal neurologic deficits are appreciated.  Skin:  Skin is warm, dry and intact. No rash noted. Psychiatric: Mood and affect are normal. Speech and behavior are normal. GU: Deferred    ____________________________________________   LABS (all labs ordered are listed, but only abnormal results are displayed)  Labs Reviewed  CBC - Abnormal; Notable for the following components:      Result Value   RBC 3.50 (*)    Hemoglobin 10.8 (*)    HCT 32.2 (*)    All other components within normal limits  URINALYSIS, COMPLETE (UACMP) WITH MICROSCOPIC - Abnormal; Notable for the following components:   Color, Urine YELLOW (*)    APPearance HAZY (*)    Hgb urine dipstick LARGE (*)    Leukocytes,Ua TRACE (*)    RBC / HPF >50 (*)    All other components within normal limits  LIPASE, BLOOD  COMPREHENSIVE METABOLIC PANEL  POC URINE PREG, ED  POCT PREGNANCY, URINE   ____________________________________________     PROCEDURES  Procedure(s) performed (including Critical Care):  Procedures   ____________________________________________   INITIAL IMPRESSION / ASSESSMENT AND PLAN / ED COURSE  Lynn Lynch was evaluated in Emergency Department on 02/08/2020 for the symptoms described in the history of present illness. She was evaluated in the context of the global COVID-19 pandemic, which necessitated consideration that the patient might be at risk for infection with the SARS-CoV-2 virus that causes COVID-19. Institutional protocols and algorithms that pertain to the evaluation of patients at risk for COVID-19 are in a state of rapid change based on information released by regulatory bodies including the CDC and federal and state organizations. These policies and algorithms were followed during the patient's care in the ED.    Patient is a 29 year old with history of tubal ligation who comes in with irregular.  That is light in nature.  Will get labs to make sure no signs of anemia.  Will get pregnancy test to make sure no signs of ectopic pregnancy.  No pain on 1 side of her back to suggest kidney stone and the bleeding is coming from her vagina not her urine.  She denies any  pain on one side of her abdomen to suggest ovarian torsion.  Her abdomen is soft and reassuring I have low suspicion for appendicitis.   Pregnancy test was negative Hemoglobin slightly lower than baseline at 10.8 but patient was just recently got off her menstruation. UA does show some blood but again patient is recently menstruating.  Reviewed patient's prior ultrasounds and she did have a ultrasound in 2018 showing endometrium thickening of 18 mm and they recommended a repeat ultrasound in 6 to 8 weeks.  Patient never had this follow-up.  Discussed with patient I would recommend repeat ultrasound to evaluate for fibroid, endometriosis, thickened endometrium that could be causing her irregular bleeding.  Patient states that she is here with her 2 children and then if it can be done outpatient she would prefer to have it done with her outpatient OB/GYN.  Given I have low suspicion for torsion or other acute process since her pain is very mild and  on both sides I think that this is reasonable.  I offered patient Tylenol and Toradol but she again declined stating that her pain was very manageable and that she just take some Tylenol at home.  I suspect her pain is from the irregular menstruation.  I discussed the provisional nature of ED diagnosis, the treatment so far, the ongoing plan of care, follow up appointments and return precautions with the patient and any family or support people present. They expressed understanding and agreed with the plan, discharged home.   ____________________________________________   FINAL CLINICAL IMPRESSION(S) / ED DIAGNOSES   Final diagnoses:  Pelvic pain  Abnormal menstruation      MEDICATIONS GIVEN DURING THIS VISIT:  Medications  sodium chloride flush (NS) 0.9 % injection 3 mL (has no administration in time range)     ED Discharge Orders    None       Note:  This document was prepared using Dragon voice recognition software and may include  unintentional dictation errors.   Vanessa Kipton, MD 02/08/20 1755

## 2020-02-08 NOTE — ED Notes (Signed)
This RN walked in to give dc paperwork and follow up instructions but patient was not in the room and had already left. Pt did not sign dc paperwork.

## 2020-02-08 NOTE — Discharge Instructions (Addendum)
You should follow-up with your OB/GYN.  You had an abnormal ultrasound 2018 you will need repeat ultrasound to assess for your endometrium thickening, fibroids, endometriosis these could all cause irregular bleeding.  If you continue to have irregular bleeding your OB/GYN may elect to start you on a birth control to help regulate it.  You return to the ER if you develop worsening pain on one side of your abdomen, worsening bleeding or any other concerns

## 2020-02-08 NOTE — ED Triage Notes (Signed)
PT to ED via POV from home c/o lower abd and back pain. Denies urinary sx. Is having small amount of vaginal bleeding, just came off of cycle. No N/V/D or fevers.

## 2020-03-15 ENCOUNTER — Ambulatory Visit: Payer: Medicaid Other | Admitting: Physician Assistant

## 2020-03-15 ENCOUNTER — Other Ambulatory Visit: Payer: Self-pay

## 2020-03-15 DIAGNOSIS — N760 Acute vaginitis: Secondary | ICD-10-CM

## 2020-03-15 DIAGNOSIS — A5901 Trichomonal vulvovaginitis: Secondary | ICD-10-CM | POA: Diagnosis not present

## 2020-03-15 DIAGNOSIS — B9689 Other specified bacterial agents as the cause of diseases classified elsewhere: Secondary | ICD-10-CM

## 2020-03-15 DIAGNOSIS — Z113 Encounter for screening for infections with a predominantly sexual mode of transmission: Secondary | ICD-10-CM

## 2020-03-15 LAB — WET PREP FOR TRICH, YEAST, CLUE
Trichomonas Exam: POSITIVE — AB
Yeast Exam: NEGATIVE

## 2020-03-15 MED ORDER — METRONIDAZOLE 500 MG PO TABS: 500.0000 mg | ORAL_TABLET | Freq: Two times a day (BID) | ORAL | 0 refills | Status: AC

## 2020-03-15 NOTE — Progress Notes (Signed)
Wet mount reviewed with provider and pt treated for Trich and BV per Sadie Haber, PA order. Provider orders completed.

## 2020-03-16 ENCOUNTER — Encounter: Payer: Self-pay | Admitting: Physician Assistant

## 2020-03-16 NOTE — Progress Notes (Signed)
[facility] Department STI clinic/screening visit  Subjective:  Lynn Lynch is a 29 y.o. female being seen today for an STI screening visit. The patient reports they do have symptoms.  Patient reports that they do not desire a pregnancy in the next year.   They reported they are not interested in discussing contraception today.  No LMP recorded (approximate).   Patient has the following medical conditions:   Patient Active Problem List   Diagnosis Date Noted  . Sterilization 08/02/2013  . Late prenatal care 04/07/2013  . Chlamydia infection, current pregnancy x 2 episodes 04/07/2013    Chief Complaint  Patient presents with  . SEXUALLY TRANSMITTED DISEASE    screening    HPI  Patient reports that she has a "strong smell" when she urinates for about 1 week.  Denies other symptoms, chronic conditions, and regular medicines.  LMP 03/02/2020 and normal.  Patient has had BTL for BCM.  Per patient, last pap was 4-5 yrs ago and last HIV test in 09/2019.  See flowsheet for further details and programmatic requirements.    The following portions of the patient's history were reviewed and updated as appropriate: allergies, current medications, past medical history, past social history, past surgical history and problem list.  Objective:  There were no vitals filed for this visit.  Physical Exam Constitutional:      General: She is not in acute distress.    Appearance: Normal appearance.  HENT:     Head: Normocephalic and atraumatic.     Comments: No nits, lice or hair loss. No cervical, supraclavicular or axillary adenopathy.    Mouth/Throat:     Mouth: Mucous membranes are moist.     Pharynx: Oropharynx is clear. No oropharyngeal exudate or posterior oropharyngeal erythema.  Eyes:     Conjunctiva/sclera: Conjunctivae normal.  Pulmonary:     Effort: Pulmonary effort is normal.  Abdominal:     Palpations: Abdomen is soft. There is no mass.     Tenderness: There  is no abdominal tenderness. There is no guarding or rebound.  Genitourinary:    General: Normal vulva.     Rectum: Normal.     Comments: External genitalia/pubic area without nits, lice, edema, erythema, lesions and inguinal adenopathy. Vagina with normal mucosa and moderate amount of whitish/yellow, thin, mucoid discharge, pH=>4.5. Cervix without visible lesions. Uterus firm, mobile, nt, no masses, no CMT, no adnexal tenderness or fullness. Musculoskeletal:     Cervical back: Neck supple. No tenderness.  Skin:    General: Skin is warm and dry.     Findings: No bruising, erythema, lesion or rash.  Neurological:     Mental Status: She is alert and oriented to person, place, and time.  Psychiatric:        Mood and Affect: Mood normal.        Thought Content: Thought content normal.        Judgment: Judgment normal.      Assessment and Plan:  Lynn Lynch is a 29 y.o. female presenting to the [facility] Department for STI screening  1. Screening for STD (sexually transmitted disease) Patient into clinic with symptoms. Rec condoms with all sex. Await test results.  Counseled that RN will call if needs to RTC for further treatment once results are back.  - WET PREP FOR TRICH, YEAST, CLUE - Gonococcus culture - Chlamydia/Gonorrhea [place] [facility] - HIV [place] [facility] - Syphilis Serology, [place] [facility]  2. BV (bacterial vaginosis) Will  treat BV (and Trich) with Metronidazole 500mg  #14 1 po BID for 7 days with food, no EtOH for 24 hr before and until 72 hr after completing medicine. No sex for 7 days and until after partner completes treatment. Enc to use OTC antifungal cream if has itching during or just after completing antibiotic. - metroNIDAZOLE (FLAGYL) 500 MG tablet; Take 1 tablet (500 mg total) by mouth 2 (two) times daily for 7 days.  Dispense: 14 tablet; Refill: 0  3. Trichomonal vaginitis See above for treatment.     Return in about 3 months (around 06/15/2020)  for TOC and PRN.  No future appointments.  Jerene Dilling, PA

## 2020-03-20 LAB — GONOCOCCUS CULTURE

## 2020-04-28 ENCOUNTER — Encounter: Payer: Self-pay | Admitting: Physician Assistant

## 2020-07-11 ENCOUNTER — Other Ambulatory Visit: Payer: Self-pay

## 2020-07-11 ENCOUNTER — Emergency Department
Admission: EM | Admit: 2020-07-11 | Discharge: 2020-07-12 | Disposition: A | Discharge status: Home / Self Care | Payer: Medicaid Other | Attending: Emergency Medicine | Admitting: Emergency Medicine

## 2020-07-11 ENCOUNTER — Encounter: Payer: Self-pay | Admitting: Emergency Medicine

## 2020-07-11 DIAGNOSIS — M549 Dorsalgia, unspecified: Secondary | ICD-10-CM | POA: Diagnosis not present

## 2020-07-11 DIAGNOSIS — Z5321 Procedure and treatment not carried out due to patient leaving prior to being seen by health care provider: Secondary | ICD-10-CM | POA: Insufficient documentation

## 2020-07-11 DIAGNOSIS — R1030 Lower abdominal pain, unspecified: Secondary | ICD-10-CM | POA: Diagnosis present

## 2020-07-11 LAB — COMPREHENSIVE METABOLIC PANEL
ALT: 13 U/L (ref 0–44)
AST: 20 U/L (ref 15–41)
Albumin: 3.7 g/dL (ref 3.5–5.0)
Alkaline Phosphatase: 38 U/L (ref 38–126)
Anion gap: 9 (ref 5–15)
BUN: 12 mg/dL (ref 6–20)
CO2: 25 mmol/L (ref 22–32)
Calcium: 8.9 mg/dL (ref 8.9–10.3)
Chloride: 103 mmol/L (ref 98–111)
Creatinine, Ser: 0.84 mg/dL (ref 0.44–1.00)
GFR calc Af Amer: >60 mL/min (ref >60)
GFR calc non Af Amer: >60 mL/min (ref >60)
Glucose, Bld: 85 mg/dL (ref 70–99)
Potassium: 3.5 mmol/L (ref 3.5–5.1)
Sodium: 137 mmol/L (ref 135–145)
Total Bilirubin: 0.5 mg/dL (ref 0.3–1.2)
Total Protein: 7.6 g/dL (ref 6.5–8.1)

## 2020-07-11 LAB — URINALYSIS, COMPLETE (UACMP) WITH MICROSCOPIC
Bacteria, UA: NONE SEEN
Bilirubin Urine: NEGATIVE
Glucose, UA: NEGATIVE mg/dL
Hgb urine dipstick: NEGATIVE
Ketones, ur: NEGATIVE mg/dL
Nitrite: NEGATIVE
Protein, ur: NEGATIVE mg/dL
Specific Gravity, Urine: 1.017 (ref 1.005–1.030)
pH: 7 (ref 5.0–8.0)

## 2020-07-11 LAB — POCT PREGNANCY, URINE: Preg Test, Ur: NEGATIVE

## 2020-07-11 LAB — CBC
HCT: 31.6 % — ABNORMAL LOW (ref 36.0–46.0)
Hemoglobin: 10.5 g/dL — ABNORMAL LOW (ref 12.0–15.0)
MCH: 30.7 pg (ref 26.0–34.0)
MCHC: 33.2 g/dL (ref 30.0–36.0)
MCV: 92.4 fL (ref 80.0–100.0)
Platelets: 285 10*3/uL (ref 150–400)
RBC: 3.42 MIL/uL — ABNORMAL LOW (ref 3.87–5.11)
RDW: 13.5 % (ref 11.5–15.5)
WBC: 5.6 10*3/uL (ref 4.0–10.5)
nRBC: 0 % (ref 0.0–0.2)

## 2020-07-11 LAB — LIPASE, BLOOD: Lipase: 23 U/L (ref 11–51)

## 2020-07-11 NOTE — ED Triage Notes (Signed)
Here for lower mid abdominal and back pain. Period is late but pt does not think she is pregnant. No other symptoms, just pain. VSS. No fever. Unlabored.

## 2020-07-21 ENCOUNTER — Encounter: Payer: Self-pay | Admitting: Emergency Medicine

## 2020-07-21 ENCOUNTER — Emergency Department: Payer: Medicaid Other

## 2020-07-21 ENCOUNTER — Emergency Department
Admission: EM | Admit: 2020-07-21 | Discharge: 2020-07-21 | Disposition: A | Discharge status: Home / Self Care | Payer: Medicaid Other | Attending: Emergency Medicine | Admitting: Emergency Medicine

## 2020-07-21 DIAGNOSIS — Z87891 Personal history of nicotine dependence: Secondary | ICD-10-CM | POA: Insufficient documentation

## 2020-07-21 DIAGNOSIS — R1010 Upper abdominal pain, unspecified: Secondary | ICD-10-CM

## 2020-07-21 DIAGNOSIS — R1011 Right upper quadrant pain: Secondary | ICD-10-CM | POA: Insufficient documentation

## 2020-07-21 LAB — CBC
HCT: 32.8 % — ABNORMAL LOW (ref 36.0–46.0)
Hemoglobin: 11.2 g/dL — ABNORMAL LOW (ref 12.0–15.0)
MCH: 30.9 pg (ref 26.0–34.0)
MCHC: 34.1 g/dL (ref 30.0–36.0)
MCV: 90.6 fL (ref 80.0–100.0)
Platelets: 265 10*3/uL (ref 150–400)
RBC: 3.62 MIL/uL — ABNORMAL LOW (ref 3.87–5.11)
RDW: 13.3 % (ref 11.5–15.5)
WBC: 4.2 10*3/uL (ref 4.0–10.5)
nRBC: 0 % (ref 0.0–0.2)

## 2020-07-21 LAB — URINALYSIS, COMPLETE (UACMP) WITH MICROSCOPIC
Glucose, UA: NEGATIVE mg/dL
Hgb urine dipstick: NEGATIVE
Ketones, ur: NEGATIVE mg/dL
Nitrite: NEGATIVE
Protein, ur: 30 mg/dL — AB
Specific Gravity, Urine: 1.036 — ABNORMAL HIGH (ref 1.005–1.030)
pH: 5 (ref 5.0–8.0)

## 2020-07-21 LAB — COMPREHENSIVE METABOLIC PANEL
ALT: 16 U/L (ref 0–44)
AST: 22 U/L (ref 15–41)
Albumin: 3.8 g/dL (ref 3.5–5.0)
Alkaline Phosphatase: 38 U/L (ref 38–126)
Anion gap: 6 (ref 5–15)
BUN: 14 mg/dL (ref 6–20)
CO2: 24 mmol/L (ref 22–32)
Calcium: 8.2 mg/dL — ABNORMAL LOW (ref 8.9–10.3)
Chloride: 104 mmol/L (ref 98–111)
Creatinine, Ser: 0.76 mg/dL (ref 0.44–1.00)
GFR, Estimated: >60 mL/min (ref >60)
Glucose, Bld: 101 mg/dL — ABNORMAL HIGH (ref 70–99)
Potassium: 3.7 mmol/L (ref 3.5–5.1)
Sodium: 134 mmol/L — ABNORMAL LOW (ref 135–145)
Total Bilirubin: 0.4 mg/dL (ref 0.3–1.2)
Total Protein: 7.8 g/dL (ref 6.5–8.1)

## 2020-07-21 LAB — LIPASE, BLOOD: Lipase: 26 U/L (ref 11–51)

## 2020-07-21 LAB — POCT PREGNANCY, URINE: Preg Test, Ur: NEGATIVE

## 2020-07-21 MED ORDER — PANTOPRAZOLE SODIUM 20 MG PO TBEC: 20.0000 mg | DELAYED_RELEASE_TABLET | Freq: Every day | ORAL | 1 refills | Status: DC

## 2020-07-21 MED ORDER — SUCRALFATE 1 G PO TABS: 1.0000 g | ORAL_TABLET | Freq: Four times a day (QID) | ORAL | 0 refills | Status: DC

## 2020-07-21 NOTE — ED Triage Notes (Signed)
Pt c/o right mid abdominal pain x1 week. Seen for same on 9/29. Pt denies N/V/D and urinary symptoms.

## 2020-07-21 NOTE — ED Provider Notes (Signed)
[facility] Emergency Department Provider Note   ____________________________________________    I have reviewed the triage vital signs and the nursing notes.   HISTORY  Chief Complaint Abdominal Pain     HPI Niang Mitcheltree is a 29 y.o. female who reports over the last 1 to 2 weeks she has had pain in her right upper quadrant, particularly after eating.  Typically she has taken Tylenol to help with the pain.  Sometimes it is worse if she lies on her right side at night.  Denies fevers or chills.  No diarrhea.  Positive nausea with the pain.  No radiation of the pain.  No history of abdominal surgery besides a tubal ligation.  No vaginal discharge or dysuria  Past Medical History:  Diagnosis Date  . Gestational hypertension   . Pregnancy induced hypertension    G1 - Pre-E    Patient Active Problem List   Diagnosis Date Noted  . Sterilization 08/02/2013  . Late prenatal care 04/07/2013  . Chlamydia infection, current pregnancy x 2 episodes 04/07/2013    Past Surgical History:  Procedure Laterality Date  . NO PAST SURGERIES    . TUBAL LIGATION Bilateral 08/02/2013   Procedure: POST PARTUM TUBAL LIGATION;  Surgeon: Willodean Rosenthal, MD;  Location: [facility];  Service: Gynecology;  Laterality: Bilateral;    Prior to Admission medications   Not on File     Allergies Patient has no known allergies.  Family History  Problem Relation Age of Onset  . Hypertension Mother     Social History Social History   Tobacco Use  . Smoking status: Former Smoker    Quit date: 10/21/2011    Years since quitting: 8.7  . Smokeless tobacco: Never Used  Vaping Use  . Vaping Use: Never used  Substance Use Topics  . Alcohol use: No  . Drug use: No    Review of Systems  Constitutional: No fever/chills Eyes: No visual changes.  ENT: No sore throat. Cardiovascular: Denies chest pain. Respiratory: Denies shortness of breath. Gastrointestinal: As  above Genitourinary: As above Musculoskeletal: Negative for back pain. Skin: Negative for rash. Neurological: Negative for headaches or weakness   ____________________________________________   PHYSICAL EXAM:  VITAL SIGNS: ED Triage Vitals  Enc Vitals Group     BP 07/21/20 0418 127/84     Pulse Rate 07/21/20 0418 80     Resp 07/21/20 0418 17     Temp 07/21/20 0418 98.2 F (36.8 C)     Temp Source 07/21/20 0418 Oral     SpO2 07/21/20 0418 100 %     Weight 07/21/20 0419 120.2 kg (265 lb)     Height 07/21/20 0419 1.549 m (5\' 1" )     Head Circumference --      Peak Flow --      Pain Score --      Pain Loc --      Pain Edu? --      Excl. in GC? --     Constitutional: Alert and oriented. No acute distress. Pleasant and interactive  Nose: No congestion/rhinnorhea. Mouth/Throat: Mucous membranes are moist.    Cardiovascular: Normal rate, regular rhythm. Grossly normal heart sounds.  Good peripheral circulation. Respiratory: Normal respiratory effort.  No retractions. Lungs CTAB. Gastrointestinal: Soft, mild tenderness right upper quadrant ,no distention.  No CVA tenderness. Genitourinary: deferred Musculoskeletal:   Warm and well perfused Neurologic:  Normal speech and language. No gross focal neurologic deficits are appreciated.  Skin:  Skin is warm, dry and intact. No rash noted. Psychiatric: Mood and affect are normal. Speech and behavior are normal.  ____________________________________________   LABS (all labs ordered are listed, but only abnormal results are displayed)  Labs Reviewed  COMPREHENSIVE METABOLIC PANEL - Abnormal; Notable for the following components:      Result Value   Sodium 134 (*)    Glucose, Bld 101 (*)    Calcium 8.2 (*)    All other components within normal limits  CBC - Abnormal; Notable for the following components:   RBC 3.62 (*)    Hemoglobin 11.2 (*)    HCT 32.8 (*)    All other components within normal limits  URINALYSIS, COMPLETE  (UACMP) WITH MICROSCOPIC - Abnormal; Notable for the following components:   Color, Urine AMBER (*)    APPearance CLOUDY (*)    Specific Gravity, Urine 1.036 (*)    Bilirubin Urine SMALL (*)    Protein, ur 30 (*)    Leukocytes,Ua MODERATE (*)    Bacteria, UA RARE (*)    All other components within normal limits  URINE CULTURE  LIPASE, BLOOD  POC URINE PREG, ED  POCT PREGNANCY, URINE   ____________________________________________  EKG  None ____________________________________________  RADIOLOGY  Ultrasound right upper quadrant reviewed by me, no clear stones, no wall thickening ____________________________________________   PROCEDURES  Procedure(s) performed: No  Procedures   Critical Care performed: No ____________________________________________   INITIAL IMPRESSION / ASSESSMENT AND PLAN / ED COURSE  Pertinent labs & imaging results that were available during my care of the patient were reviewed by me and considered in my medical decision making (see chart for details).  Patient presents with right upper quadrant abdominal pain, worse after eating.  Highly suspicious for biliary colic, possible cholecystitis.  Also on the differential gastritis, pancreatitis.  LFTs and lipase are reassuring, white blood cell count is unremarkable.  Urinalysis likely contaminated.  Does have tenderness that is mild in the right upper quadrant, will send for ultrasound to evaluate further.    ----------------------------------------- 10:14 AM on 07/21/2020 -----------------------------------------  Radiology has read ultrasound as having gallbladder sludge, this could be causing her symptoms.  She is asymptomatic at this time.  I will start her on Protonix, Carafate in case there is a component of gastritis, have her follow-up with surgery to discuss possible cholecystectomy    ____________________________________________   FINAL CLINICAL IMPRESSION(S) / ED  DIAGNOSES  Final diagnoses:  Upper abdominal pain        Note:  This document was prepared using Dragon voice recognition software and may include unintentional dictation errors.   Jene Every, MD 07/21/20 1014

## 2020-07-21 NOTE — ED Notes (Signed)
Pt to front desk asking about wait time. Pt informed that there are still a few people ahead of her and we are unable to give wait time. Pt ambulatory without difficulty or distress.

## 2020-07-22 LAB — URINE CULTURE

## 2020-07-24 ENCOUNTER — Ambulatory Visit: Payer: Medicaid Other

## 2020-07-31 ENCOUNTER — Ambulatory Visit: Payer: Medicaid Other | Admitting: Surgery

## 2020-07-31 ENCOUNTER — Other Ambulatory Visit: Payer: Self-pay

## 2020-08-21 ENCOUNTER — Other Ambulatory Visit: Payer: Self-pay

## 2020-08-21 ENCOUNTER — Ambulatory Visit: Payer: Medicaid Other | Admitting: Physician Assistant

## 2020-08-21 DIAGNOSIS — Z113 Encounter for screening for infections with a predominantly sexual mode of transmission: Secondary | ICD-10-CM | POA: Diagnosis not present

## 2020-08-21 DIAGNOSIS — A5901 Trichomonal vulvovaginitis: Secondary | ICD-10-CM

## 2020-08-21 LAB — WET PREP FOR TRICH, YEAST, CLUE
Trichomonas Exam: POSITIVE — AB
Yeast Exam: NEGATIVE

## 2020-08-21 MED ORDER — METRONIDAZOLE 500 MG PO TABS: 500.0000 mg | ORAL_TABLET | Freq: Two times a day (BID) | ORAL | 0 refills | Status: DC

## 2020-08-22 ENCOUNTER — Encounter: Payer: Self-pay | Admitting: Physician Assistant

## 2020-08-22 MED ORDER — METRONIDAZOLE 500 MG PO TABS: 500.0000 mg | ORAL_TABLET | Freq: Two times a day (BID) | ORAL | 0 refills | Status: AC

## 2020-08-22 NOTE — Progress Notes (Signed)
[facility] Department STI clinic/screening visit  Subjective:  Lynn Lynch is a 29 y.o. female being seen today for an STI screening visit. The patient reports they do have symptoms.  Patient reports that they do not desire a pregnancy in the next year.   They reported they are not interested in discussing contraception today.  Patient's last menstrual period was 08/04/2020.   Patient has the following medical conditions:   Patient Active Problem List   Diagnosis Date Noted  . Sterilization 08/02/2013  . Late prenatal care 04/07/2013  . Chlamydia infection, current pregnancy x 2 episodes 04/07/2013    Chief Complaint  Patient presents with  . SEXUALLY TRANSMITTED DISEASE    screening    HPI  Patient reports that she has had a vaginal odor for 3-4 days.  Denies other symptoms, chronic conditions and regular medicines.  Reports that her last HIV test and pap were in 03/2020.  States that she has had a BTL as her BCM.   See flowsheet for further details and programmatic requirements.    The following portions of the patient's history were reviewed and updated as appropriate: allergies, current medications, past medical history, past social history, past surgical history and problem list.  Objective:  There were no vitals filed for this visit.  Physical Exam Constitutional:      General: She is not in acute distress.    Appearance: Normal appearance.  HENT:     Head: Normocephalic and atraumatic.     Comments: No nits,lice, or hair loss. No cervical, supraclavicular or axillary adenopathy.    Mouth/Throat:     Mouth: Mucous membranes are moist.     Pharynx: Oropharynx is clear. No oropharyngeal exudate or posterior oropharyngeal erythema.  Eyes:     Conjunctiva/sclera: Conjunctivae normal.  Pulmonary:     Effort: Pulmonary effort is normal.  Musculoskeletal:     Cervical back: Neck supple. No tenderness.  Skin:    General: Skin is warm and dry.      Findings: No bruising, erythema, lesion or rash.  Neurological:     Mental Status: She is alert and oriented to person, place, and time.  Psychiatric:        Mood and Affect: Mood normal.        Behavior: Behavior normal.        Thought Content: Thought content normal.        Judgment: Judgment normal.      Assessment and Plan:  Lynn Lynch is a 29 y.o. female presenting to the [facility] Department for STI screening  1. Screening for STD (sexually transmitted disease) Patient into clinic with symptoms. Patient opts to self-collect vaginal samples for testing today.  Counseled patient how to collect for accurate results. Rec condoms with all sex. Await test results.  Counseled that RN will call if needs to RTC for treatment once results are back. - WET PREP FOR TRICH, YEAST, CLUE - Gonococcus culture - Chlamydia/Gonorrhea [facility] - HIV [place] [facility] - Syphilis Serology, [facility]  2. Trichomonal vaginitis Will treat for Trich with Metronidazole 500 mg #14 1 po BID for 7 days with food, no EtOH for 24 hr before and until 72 hr after completing medicine. No sex for 14 days and until after partner completes treatment. Enc to use OTC antifungal cream if has itching during or just after antibiotic use. - metroNIDAZOLE (FLAGYL) 500 MG tablet; Take 1 tablet (500 mg total) by mouth 2 (  two) times daily for 7 days.  Dispense: 14 tablet; Refill: 0     No follow-ups on file.  No future appointments.  Matt Holmes, PA

## 2020-08-26 LAB — GONOCOCCUS CULTURE

## 2020-08-26 NOTE — Progress Notes (Signed)
Chart reviewed by Pharmacist  Suzanne Walker PharmD, Contract Pharmacist at  [facility]  

## 2020-11-11 IMAGING — US US ABDOMEN LIMITED
1 series · 14 of 25 positions shown · non-contrast
Comparison: None.

CLINICAL DATA: Right upper quadrant abdominal pain.

EXAM:
ULTRASOUND ABDOMEN LIMITED RIGHT UPPER QUADRANT

[Series 1: us abdomen limited ruq · 14 of 42 slices shown]
[im 1/42]
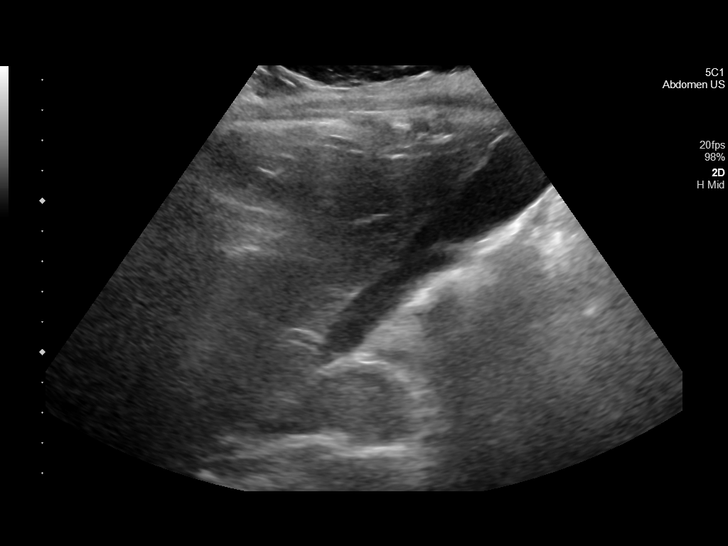
[im 4/42]
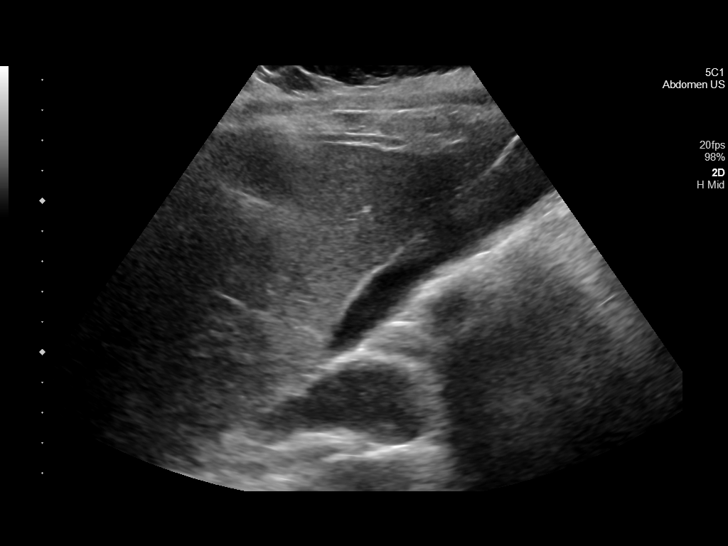
[im 7/42]
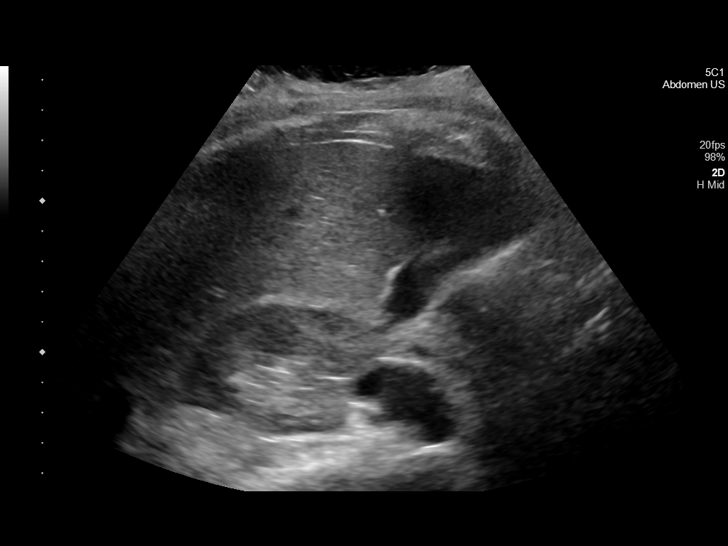
[im 11/42]
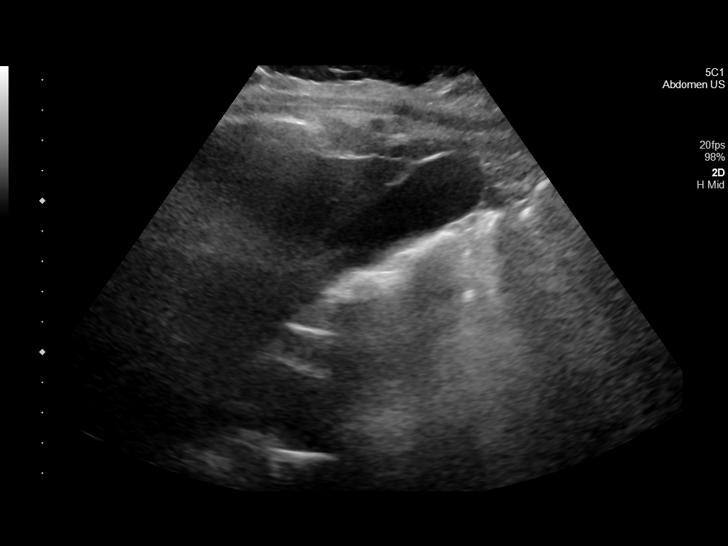
[im 14/42]
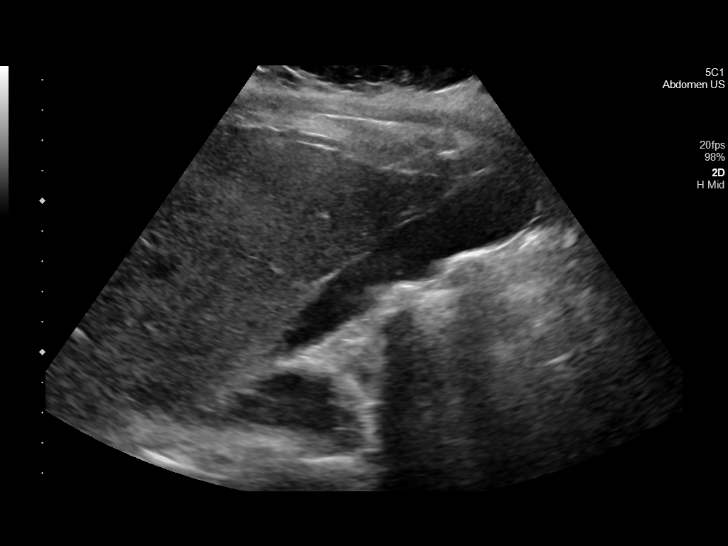
[im 16/42]
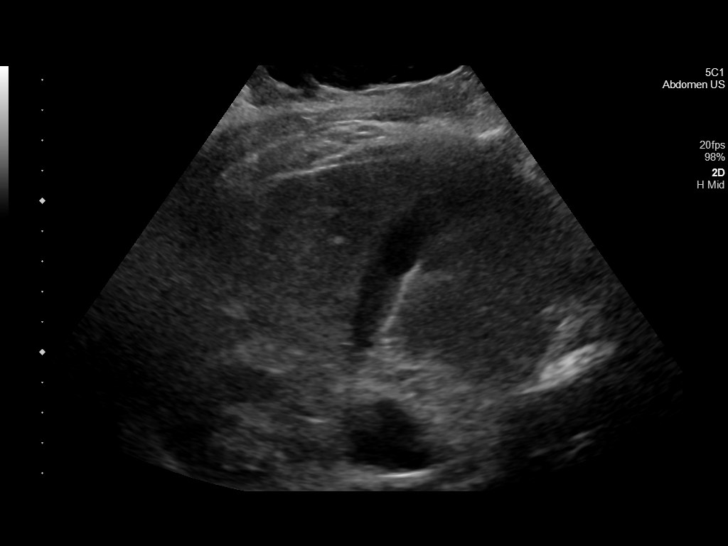
[im 19/42]
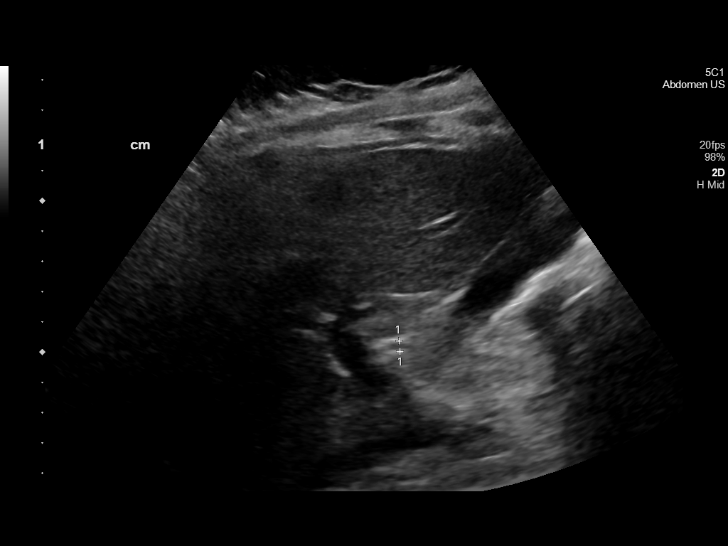
[im 23/42]
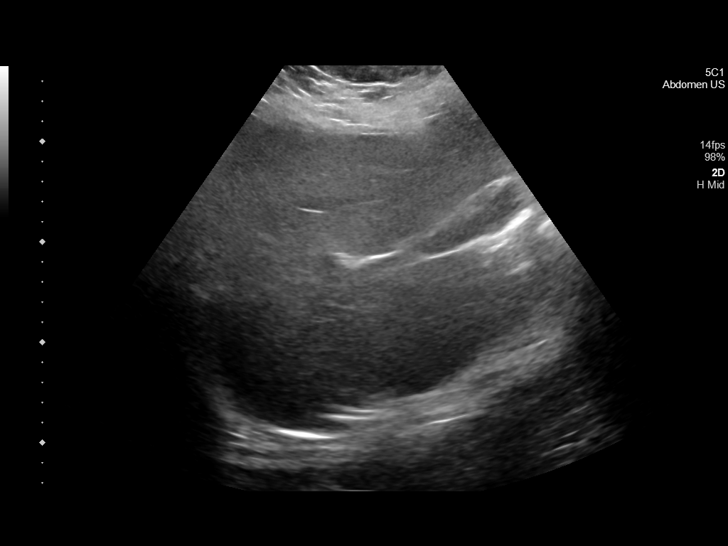
[im 26/42]
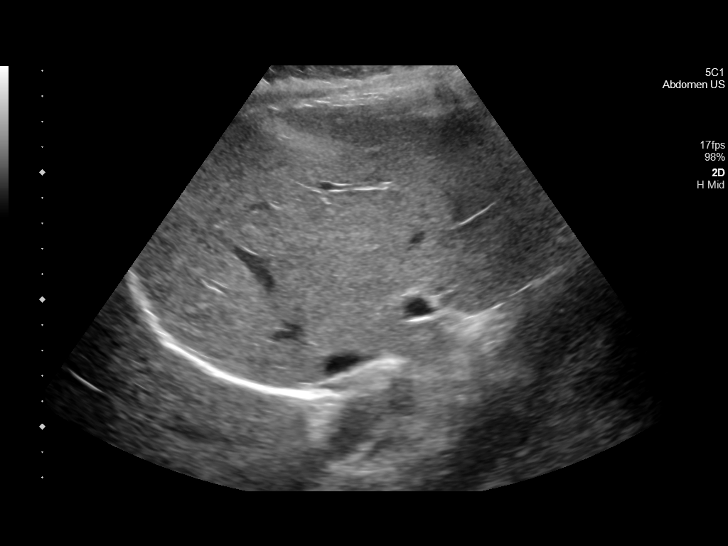
[im 28/42]
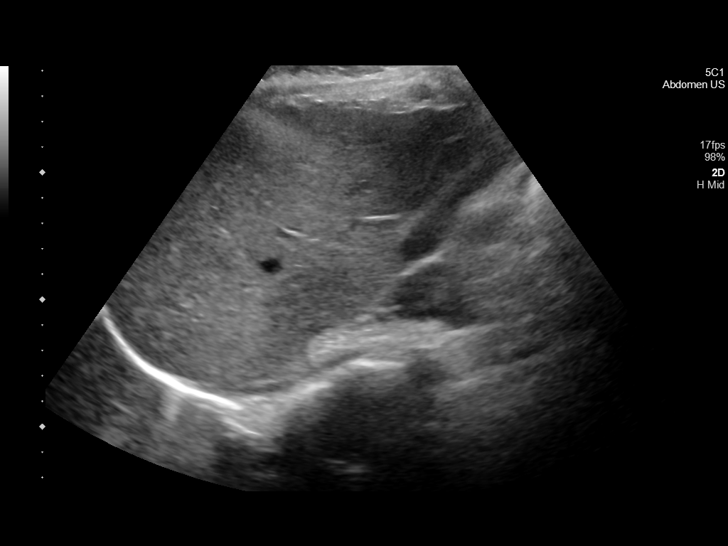
[im 31/42]
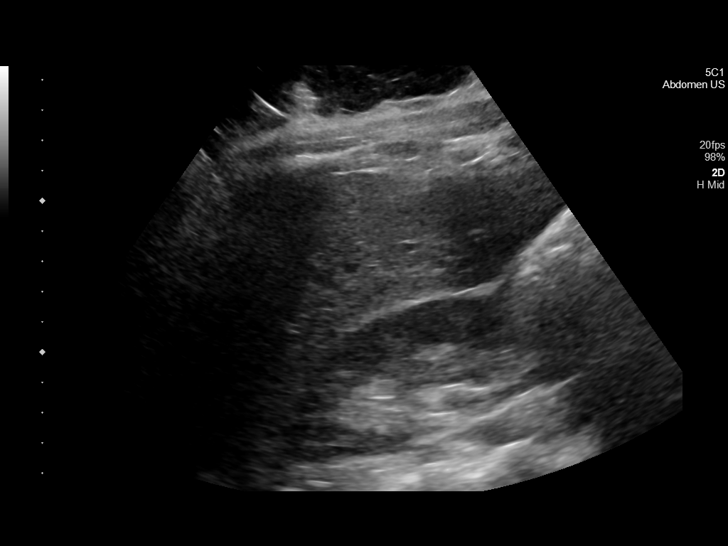
[im 35/42]
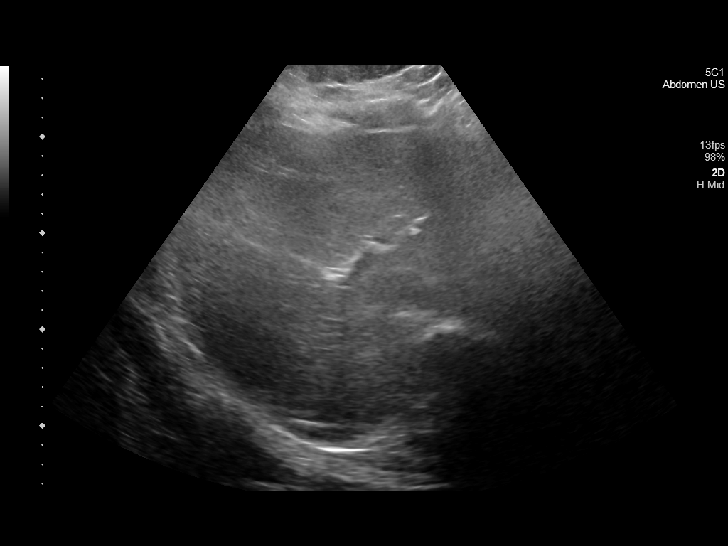
[im 38/42]
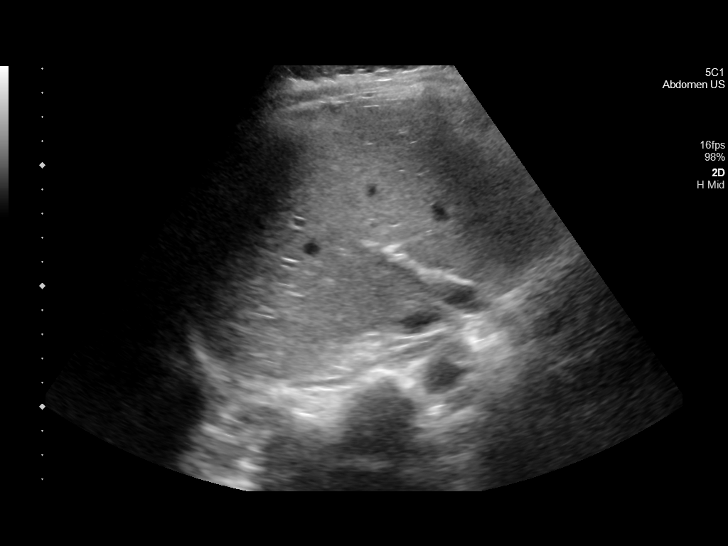
[im 42/42]
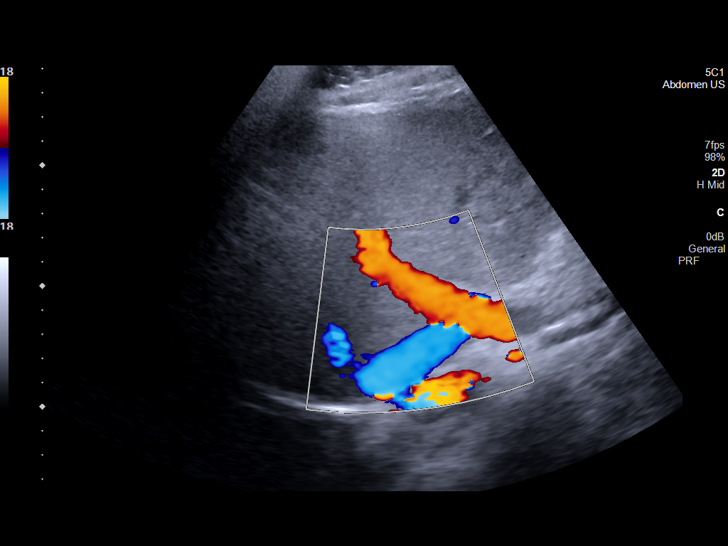

[14 of 25 positions shown; findings below may reference images not displayed]

FINDINGS: Gallbladder:

Small volume dependent gallbladder sludge. No wall thickening or
pericholecystic fluid. Sonographic Murphy's sign was not elicited.

Common bile duct:

Diameter: Normal, 4 mm.

Liver:

No focal lesion identified. Within normal limits in parenchymal
echogenicity. Portal vein is patent on color Doppler imaging with
normal direction of blood flow towards the liver.

Other: No ascites.
IMPRESSION: Gallbladder sludge, without acute cholecystitis or biliary duct
dilatation.

## 2021-01-07 ENCOUNTER — Other Ambulatory Visit: Payer: Self-pay

## 2021-01-07 ENCOUNTER — Emergency Department (HOSPITAL_COMMUNITY)
Admission: EM | Admit: 2021-01-07 | Discharge: 2021-01-07 | Disposition: A | Discharge status: Home / Self Care | Payer: Medicaid Other | Attending: Emergency Medicine | Admitting: Emergency Medicine

## 2021-01-07 DIAGNOSIS — R0981 Nasal congestion: Secondary | ICD-10-CM | POA: Diagnosis not present

## 2021-01-07 DIAGNOSIS — R059 Cough, unspecified: Secondary | ICD-10-CM | POA: Insufficient documentation

## 2021-01-07 DIAGNOSIS — Z87891 Personal history of nicotine dependence: Secondary | ICD-10-CM | POA: Insufficient documentation

## 2021-01-07 DIAGNOSIS — J392 Other diseases of pharynx: Secondary | ICD-10-CM | POA: Diagnosis not present

## 2021-01-07 DIAGNOSIS — H60331 Swimmer's ear, right ear: Secondary | ICD-10-CM | POA: Insufficient documentation

## 2021-01-07 DIAGNOSIS — H9201 Otalgia, right ear: Secondary | ICD-10-CM | POA: Diagnosis present

## 2021-01-07 MED ORDER — AMOXICILLIN 500 MG PO TABS: 1000.0000 mg | ORAL_TABLET | Freq: Two times a day (BID) | ORAL | 0 refills | Status: DC

## 2021-01-07 MED ORDER — OFLOXACIN 0.3 % OT SOLN: 5.0000 [drp] | Freq: Two times a day (BID) | OTIC | 0 refills | Status: DC

## 2021-01-07 NOTE — ED Provider Notes (Signed)
[facility] EMERGENCY DEPARTMENT Provider Note   CSN: [id number] Arrival date & time: 01/07/21  1056     History Chief Complaint  Patient presents with  . Otalgia    Lynn Lynch is a 30 y.o. female.  30 yo F with a cc of R ear pain.  Going on for about 4 days.  Associated with cough, congestion, throat itching.  No fevers, chills, no sob.    The history is provided by the patient.  Otalgia Location:  Right Quality:  Aching Severity:  Moderate Onset quality:  Gradual Duration:  2 days Timing:  Constant Progression:  Worsening Chronicity:  New Relieved by:  Nothing Worsened by:  Nothing Ineffective treatments:  None tried Associated symptoms: congestion   Associated symptoms: no fever, no headaches, no rhinorrhea and no vomiting        Past Medical History:  Diagnosis Date  . Gestational hypertension   . Pregnancy induced hypertension    G1 - Pre-E    Patient Active Problem List   Diagnosis Date Noted  . Sterilization 08/02/2013  . Late prenatal care 04/07/2013  . Chlamydia infection, current pregnancy x 2 episodes 04/07/2013    Past Surgical History:  Procedure Laterality Date  . NO PAST SURGERIES    . TUBAL LIGATION Bilateral 08/02/2013   Procedure: POST PARTUM TUBAL LIGATION;  Surgeon: Willodean Rosenthal, MD;  Location: [facility];  Service: Gynecology;  Laterality: Bilateral;     OB History    Gravida  3   Para  2   Term  2   Preterm  0   AB  1   Living  2     SAB  1   IAB  0   Ectopic  0   Multiple  0   Live Births  2           Family History  Problem Relation Age of Onset  . Hypertension Mother     Social History   Tobacco Use  . Smoking status: Former Smoker    Quit date: 10/21/2011    Years since quitting: 9.2  . Smokeless tobacco: Never Used  Vaping Use  . Vaping Use: Never used  Substance Use Topics  . Alcohol use: No  . Drug use: No    Home Medications Prior to Admission medications    Medication Sig Start Date End Date Taking? Authorizing Provider  amoxicillin (AMOXIL) 500 MG tablet Take 2 tablets (1,000 mg total) by mouth 2 (two) times daily. 01/07/21  Yes Melene Plan, DO  ofloxacin (FLOXIN) 0.3 % OTIC solution Place 5 drops into the right ear 2 (two) times daily. 01/07/21  Yes Melene Plan, DO  pantoprazole (PROTONIX) 20 MG tablet Take 1 tablet (20 mg total) by mouth daily. 07/21/20 07/21/21  Jene Every, MD  sucralfate (CARAFATE) 1 g tablet Take 1 tablet (1 g total) by mouth 4 (four) times daily for 15 days. 07/21/20 08/05/20  Jene Every, MD    Allergies    Patient has no known allergies.  Review of Systems   Review of Systems  Constitutional: Negative for chills and fever.  HENT: Positive for congestion and ear pain. Negative for rhinorrhea.   Eyes: Negative for redness and visual disturbance.  Respiratory: Negative for shortness of breath and wheezing.   Cardiovascular: Negative for chest pain and palpitations.  Gastrointestinal: Negative for nausea and vomiting.  Genitourinary: Negative for dysuria and urgency.  Musculoskeletal: Negative for arthralgias and myalgias.  Skin: Negative for  pallor and wound.  Neurological: Negative for dizziness and headaches.    Physical Exam Updated Vital Signs BP 127/86   Pulse 77   Temp 98.8 F (37.1 C)   Resp 15   SpO2 98%   Physical Exam Vitals and nursing note reviewed.  Constitutional:      General: She is not in acute distress.    Appearance: She is well-developed. She is not diaphoretic.  HENT:     Head: Normocephalic and atraumatic.     Ears:     Comments: Right ear canal with some pain with distraction of the lobe.  Some edema to the canal.  Erythema and bulging of the TM.    Mouth/Throat:     Comments: Posterior nasal drip.  No tonsillar swelling or exudates.  Uvula is midline. Eyes:     Pupils: Pupils are equal, round, and reactive to light.  Cardiovascular:     Rate and Rhythm: Normal rate and  regular rhythm.     Heart sounds: No murmur heard. No friction rub. No gallop.   Pulmonary:     Effort: Pulmonary effort is normal.     Breath sounds: No wheezing or rales.  Abdominal:     General: There is no distension.     Palpations: Abdomen is soft.     Tenderness: There is no abdominal tenderness.  Musculoskeletal:        General: No tenderness.     Cervical back: Normal range of motion and neck supple.  Skin:    General: Skin is warm and dry.  Neurological:     Mental Status: She is alert and oriented to person, place, and time.  Psychiatric:        Behavior: Behavior normal.     ED Results / Procedures / Treatments   Labs (all labs ordered are listed, but only abnormal results are displayed) Labs Reviewed - No data to display  EKG None  Radiology No results found.  Procedures Procedures   Medications Ordered in ED Medications - No data to display  ED Course  I have reviewed the triage vital signs and the nursing notes.  Pertinent labs & imaging results that were available during my care of the patient were reviewed by me and considered in my medical decision making (see chart for details).    MDM Rules/Calculators/A&P                          30 yo F with right-sided ear pain.  Patient symptoms sound most like seasonal allergies with an itchy throat and rhinorrhea.  She does have some signs of otitis externa and media on the right.  Will start on antibiotics.  Have her start antihistamines.  PCP follow-up.  12:49 PM:  I have discussed the diagnosis/risks/treatment options with the patient and family and believe the pt to be eligible for discharge home to follow-up with PCP. We also discussed returning to the ED immediately if new or worsening sx occur. We discussed the sx which are most concerning (e.g., sudden worsening pain, fever, inability to tolerate by mouth) that necessitate immediate return. Medications administered to the patient during their visit  and any new prescriptions provided to the patient are listed below.  Medications given during this visit Medications - No data to display   The patient appears reasonably screen and/or stabilized for discharge and I doubt any other medical condition or other [facility] requiring further screening, evaluation, or treatment  in the ED at this time prior to discharge.   Final Clinical Impression(s) / ED Diagnoses Final diagnoses:  Acute swimmer's ear of right side    Rx / DC Orders ED Discharge Orders         Ordered    amoxicillin (AMOXIL) 500 MG tablet  2 times daily        01/07/21 1220    ofloxacin (FLOXIN) 0.3 % OTIC solution  2 times daily        01/07/21 1220           Melene Plan, DO 01/07/21 1249

## 2021-01-07 NOTE — Discharge Instructions (Signed)
Take an antihistamine.  Follow up with your family doc.

## 2021-01-07 NOTE — ED Triage Notes (Signed)
C/o right sided earache x 2-3 days,

## 2021-03-19 ENCOUNTER — Ambulatory Visit
Admission: EM | Admit: 2021-03-19 | Discharge: 2021-03-19 | Disposition: A | Discharge status: Home / Self Care | Payer: Medicaid Other | Attending: Emergency Medicine | Admitting: Emergency Medicine

## 2021-03-19 DIAGNOSIS — R350 Frequency of micturition: Secondary | ICD-10-CM | POA: Diagnosis present

## 2021-03-19 DIAGNOSIS — N390 Urinary tract infection, site not specified: Secondary | ICD-10-CM | POA: Diagnosis not present

## 2021-03-19 DIAGNOSIS — M545 Low back pain, unspecified: Secondary | ICD-10-CM | POA: Diagnosis not present

## 2021-03-19 DIAGNOSIS — R109 Unspecified abdominal pain: Secondary | ICD-10-CM | POA: Diagnosis present

## 2021-03-19 DIAGNOSIS — R300 Dysuria: Secondary | ICD-10-CM | POA: Diagnosis present

## 2021-03-19 DIAGNOSIS — R102 Pelvic and perineal pain: Secondary | ICD-10-CM | POA: Diagnosis present

## 2021-03-19 LAB — POCT URINALYSIS DIP (MANUAL ENTRY)
Bilirubin, UA: NEGATIVE
Glucose, UA: NEGATIVE mg/dL
Ketones, POC UA: NEGATIVE mg/dL
Nitrite, UA: NEGATIVE
Protein Ur, POC: NEGATIVE mg/dL
Spec Grav, UA: 1.025 (ref 1.010–1.025)
Urobilinogen, UA: 0.2 E.U./dL
pH, UA: 5.5 (ref 5.0–8.0)

## 2021-03-19 MED ORDER — NITROFURANTOIN MONOHYD MACRO 100 MG PO CAPS: 100.0000 mg | ORAL_CAPSULE | Freq: Two times a day (BID) | ORAL | 0 refills | Status: AC

## 2021-03-19 MED ORDER — NAPROXEN 500 MG PO TABS: 500.0000 mg | ORAL_TABLET | Freq: Two times a day (BID) | ORAL | 0 refills | Status: DC

## 2021-03-19 NOTE — ED Triage Notes (Signed)
Pt presents with a two day h/o low back pain and one day h/o dysuria, urinary frequency and urgency. No n/v/d, hematuria or abdominal pain. Denies BLE pain, numbness and tingling. No falls or injuries. No meds taken for sxs.

## 2021-03-19 NOTE — Discharge Instructions (Addendum)
Urine culture pending along with vaginal swab to further evaluate for causes of abdominal/pelvic pressure Begin Macrobid twice daily for 5 days to treat UTI Naprosyn twice daily as needed for back pain We will call with results and provide further treatment as needed

## 2021-03-19 NOTE — ED Provider Notes (Signed)
[facility]    CSN: [id number] Arrival date & time: 03/19/21  1307      History   Chief Complaint Chief Complaint  Patient presents with  . Back Pain  . Urinary Frequency    HPI Lynn Lynch is a 30 y.o. female presenting today for evaluation of back pain.  Reports low back pain with associated dysuria urinary frequency and lower abdominal/pelvic pressure.  Similar to prior UTI.  Has had some slight vaginal itching, but no other significant vaginal symptoms.  Last menstrual cycle around 02/27/2021.  Denies injury or trauma to back.  Denies any radiation into extremities, denies numbness or tingling.  HPI  Past Medical History:  Diagnosis Date  . Gestational hypertension   . Pregnancy induced hypertension    G1 - Pre-E    Patient Active Problem List   Diagnosis Date Noted  . Sterilization 08/02/2013  . Late prenatal care 04/07/2013  . Chlamydia infection, current pregnancy x 2 episodes 04/07/2013    Past Surgical History:  Procedure Laterality Date  . NO PAST SURGERIES    . TUBAL LIGATION Bilateral 08/02/2013   Procedure: POST PARTUM TUBAL LIGATION;  Surgeon: Willodean Rosenthal, MD;  Location: [facility];  Service: Gynecology;  Laterality: Bilateral;    OB History    Gravida  3   Para  2   Term  2   Preterm  0   AB  1   Living  2     SAB  1   IAB  0   Ectopic  0   Multiple  0   Live Births  2            Home Medications    Prior to Admission medications   Medication Sig Start Date End Date Taking? Authorizing Provider  naproxen (NAPROSYN) 500 MG tablet Take 1 tablet (500 mg total) by mouth 2 (two) times daily. 03/19/21  Yes Reanna Scoggin C, PA-C  nitrofurantoin, macrocrystal-monohydrate, (MACROBID) 100 MG capsule Take 1 capsule (100 mg total) by mouth 2 (two) times daily for 5 days. 03/19/21 03/24/21 Yes Giordan Fordham C, PA-C  pantoprazole (PROTONIX) 20 MG tablet Take 1 tablet (20 mg total) by mouth daily. 07/21/20 03/19/21   Jene Every, MD  sucralfate (CARAFATE) 1 g tablet Take 1 tablet (1 g total) by mouth 4 (four) times daily for 15 days. 07/21/20 03/19/21  Jene Every, MD    Family History Family History  Problem Relation Age of Onset  . Hypertension Mother     Social History Social History   Tobacco Use  . Smoking status: Former Smoker    Quit date: 10/21/2011    Years since quitting: 9.4  . Smokeless tobacco: Never Used  Vaping Use  . Vaping Use: Never used  Substance Use Topics  . Alcohol use: No  . Drug use: No     Allergies   Patient has no known allergies.   Review of Systems Review of Systems  Constitutional: Negative for fever.  Respiratory: Negative for shortness of breath.   Cardiovascular: Negative for chest pain.  Gastrointestinal: Negative for abdominal pain, diarrhea, nausea and vomiting.  Genitourinary: Positive for dysuria and frequency. Negative for flank pain, genital sores, hematuria, menstrual problem, vaginal bleeding, vaginal discharge and vaginal pain.  Musculoskeletal: Positive for back pain.  Skin: Negative for rash.  Neurological: Negative for dizziness, light-headedness and headaches.     Physical Exam Triage Vital Signs ED Triage Vitals  Enc Vitals Group  BP 03/19/21 1344 135/83     Pulse Rate 03/19/21 1344 65     Resp 03/19/21 1344 18     Temp 03/19/21 1344 97.9 F (36.6 C)     Temp Source 03/19/21 1344 Oral     SpO2 03/19/21 1344 98 %     Weight --      Height --      Head Circumference --      Peak Flow --      Pain Score 03/19/21 1348 8     Pain Loc --      Pain Edu? --      Excl. in [place]? --    No data found.  Updated Vital Signs BP 135/83 (BP Location: Left Arm)   Pulse 65   Temp 97.9 F (36.6 C) (Oral)   Resp 18   LMP 02/27/2021 (Exact Date)   SpO2 98%   Breastfeeding No   Visual Acuity Right Eye Distance:   Left Eye Distance:   Bilateral Distance:    Right Eye Near:   Left Eye Near:    Bilateral Near:      Physical Exam Vitals and nursing note reviewed.  Constitutional:      Appearance: She is well-developed.     Comments: No acute distress  HENT:     Head: Normocephalic and atraumatic.     Nose: Nose normal.  Eyes:     Conjunctiva/sclera: Conjunctivae normal.  Cardiovascular:     Rate and Rhythm: Normal rate.  Pulmonary:     Effort: Pulmonary effort is normal. No respiratory distress.  Abdominal:     General: There is no distension.  Musculoskeletal:        General: Normal range of motion.     Cervical back: Neck supple.     Comments: Tenderness palpation to bilateral lower lumbar areas  Skin:    General: Skin is warm and dry.  Neurological:     Mental Status: She is alert and oriented to person, place, and time.      UC Treatments / Results  Labs (all labs ordered are listed, but only abnormal results are displayed) Labs Reviewed  POCT URINALYSIS DIP (MANUAL ENTRY) - Abnormal; Notable for the following components:      Result Value   Clarity, UA hazy (*)    Blood, UA small (*)    Leukocytes, UA Trace (*)    All other components within normal limits  URINE CULTURE  CERVICOVAGINAL ANCILLARY ONLY    EKG   Radiology No results found.  Procedures Procedures (including critical care time)  Medications Ordered in UC Medications - No data to display  Initial Impression / Assessment and Plan / UC Course  I have reviewed the triage vital signs and the nursing notes.  Pertinent labs & imaging results that were available during my care of the patient were reviewed by me and considered in my medical decision making (see chart for details).     UA with trace leuks, small hemoglobin , Treating for UTI with Macrobid twice daily x5 days, urine culture and vaginal swab pending to further confirm cause of symptoms.  Naprosyn for back pain as needed.  Alter therapy based off results.  Discussed strict return precautions. Patient verbalized understanding and is agreeable  with plan.  Final Clinical Impressions(s) / UC Diagnoses   Final diagnoses:  Lower urinary tract infection, acute  Acute bilateral low back pain without sciatica     Discharge Instructions  Urine culture pending along with vaginal swab to further evaluate for causes of abdominal/pelvic pressure Begin Macrobid twice daily for 5 days to treat UTI Naprosyn twice daily as needed for back pain We will call with results and provide further treatment as needed    ED Prescriptions    Medication Sig Dispense Auth. Provider   nitrofurantoin, macrocrystal-monohydrate, (MACROBID) 100 MG capsule Take 1 capsule (100 mg total) by mouth 2 (two) times daily for 5 days. 10 capsule Neithan Day C, PA-C   naproxen (NAPROSYN) 500 MG tablet Take 1 tablet (500 mg total) by mouth 2 (two) times daily. 30 tablet Martyna Thorns, [place] C, PA-C     PDMP not reviewed this encounter.   Sharyon Cable [place] C, PA-C 03/19/21 1502

## 2021-03-20 LAB — URINE CULTURE

## 2021-03-20 LAB — CERVICOVAGINAL ANCILLARY ONLY
Bacterial Vaginitis (gardnerella): NEGATIVE
Chlamydia: NEGATIVE
Comment: NEGATIVE
Comment: NEGATIVE
Comment: NEGATIVE
Comment: NORMAL
Neisseria Gonorrhea: NEGATIVE
Trichomonas: NEGATIVE

## 2021-06-08 ENCOUNTER — Other Ambulatory Visit: Payer: Self-pay

## 2021-06-08 ENCOUNTER — Ambulatory Visit
Admission: EM | Admit: 2021-06-08 | Discharge: 2021-06-08 | Disposition: A | Discharge status: Home / Self Care | Payer: Medicaid Other | Attending: Internal Medicine | Admitting: Internal Medicine

## 2021-06-08 ENCOUNTER — Encounter: Payer: Self-pay | Admitting: Emergency Medicine

## 2021-06-08 DIAGNOSIS — S76311A Strain of muscle, fascia and tendon of the posterior muscle group at thigh level, right thigh, initial encounter: Secondary | ICD-10-CM

## 2021-06-08 DIAGNOSIS — M79604 Pain in right leg: Secondary | ICD-10-CM

## 2021-06-08 MED ORDER — IBUPROFEN 600 MG PO TABS: 600.0000 mg | ORAL_TABLET | Freq: Four times a day (QID) | ORAL | 0 refills | Status: DC | PRN

## 2021-06-08 MED ORDER — KETOROLAC TROMETHAMINE 60 MG/2ML IM SOLN
30.0000 mg | Freq: Once | INTRAMUSCULAR | Status: AC
  Administered 2021-06-08: 30 mg via INTRAMUSCULAR

## 2021-06-08 NOTE — ED Triage Notes (Signed)
Felt a pop in right knee yesterday when moving from sitting to standing. Pain behind right knee radiating up into leg. Able to walk with limp.

## 2021-06-08 NOTE — ED Provider Notes (Signed)
[facility]    CSN: [id number] Arrival date & time: 06/08/21  0934      History   Chief Complaint Chief Complaint  Patient presents with   Leg Pain    HPI Lynn Lynch is a 30 y.o. female.   Patient presents with right leg pain that started yesterday. Patient states that she was bent down cleaning, and heard a pop and had subsequent right leg pain when she stood up. Having pain in the right posterior leg that radiates down to knee. Denies any numbness or tingling. Is able to bear weight but states that it is painful. Denies any prior injuries to right leg.    Leg Pain  Past Medical History:  Diagnosis Date   Gestational hypertension    Pregnancy induced hypertension    G1 - Pre-E    Patient Active Problem List   Diagnosis Date Noted   Sterilization 08/02/2013   Late prenatal care 04/07/2013   Chlamydia infection, current pregnancy x 2 episodes 04/07/2013    Past Surgical History:  Procedure Laterality Date   NO PAST SURGERIES     TUBAL LIGATION Bilateral 08/02/2013   Procedure: POST PARTUM TUBAL LIGATION;  Surgeon: Willodean Rosenthal, MD;  Location: [facility];  Service: Gynecology;  Laterality: Bilateral;    OB History     Gravida  3   Para  2   Term  2   Preterm  0   AB  1   Living  2      SAB  1   IAB  0   Ectopic  0   Multiple  0   Live Births  2            Home Medications    Prior to Admission medications   Medication Sig Start Date End Date Taking? Authorizing Provider  ibuprofen (ADVIL) 600 MG tablet Take 1 tablet (600 mg total) by mouth every 6 (six) hours as needed for mild pain or moderate pain. 06/08/21  Yes Lance Muss, FNP  naproxen (NAPROSYN) 500 MG tablet Take 1 tablet (500 mg total) by mouth 2 (two) times daily. 03/19/21   Wieters, Hallie C, PA-C  pantoprazole (PROTONIX) 20 MG tablet Take 1 tablet (20 mg total) by mouth daily. 07/21/20 03/19/21  Jene Every, MD  sucralfate (CARAFATE) 1 g tablet Take 1  tablet (1 g total) by mouth 4 (four) times daily for 15 days. 07/21/20 03/19/21  Jene Every, MD    Family History Family History  Problem Relation Age of Onset   Hypertension Mother     Social History Social History   Tobacco Use   Smoking status: Former    Types: Cigarettes    Quit date: 10/21/2011    Years since quitting: 9.6   Smokeless tobacco: Never  Vaping Use   Vaping Use: Never used  Substance Use Topics   Alcohol use: No   Drug use: No     Allergies   Patient has no known allergies.   Review of Systems Review of Systems Per HPI  Physical Exam Triage Vital Signs ED Triage Vitals  Enc Vitals Group     BP 06/08/21 0947 117/74     Pulse Rate 06/08/21 0947 97     Resp 06/08/21 0947 16     Temp 06/08/21 0947 98.1 F (36.7 C)     Temp Source 06/08/21 0947 Oral     SpO2 06/08/21 0947 97 %     Weight --  Height --      Head Circumference --      Peak Flow --      Pain Score 06/08/21 0948 9     Pain Loc --      Pain Edu? --      Excl. in GC? --    No data found.  Updated Vital Signs BP 117/74 (BP Location: Left Arm)   Pulse 97   Temp 98.1 F (36.7 C) (Oral)   Resp 16   SpO2 97%   Visual Acuity Right Eye Distance:   Left Eye Distance:   Bilateral Distance:    Right Eye Near:   Left Eye Near:    Bilateral Near:     Physical Exam Constitutional:      Appearance: Normal appearance.  HENT:     Head: Normocephalic and atraumatic.  Eyes:     Extraocular Movements: Extraocular movements intact.     Conjunctiva/sclera: Conjunctivae normal.  Pulmonary:     Effort: Pulmonary effort is normal.  Musculoskeletal:     Right upper leg: Tenderness present. No swelling, deformity or bony tenderness.     Left upper leg: Normal.     Right knee: No swelling, deformity, erythema, bony tenderness or crepitus. Normal range of motion. Tenderness present. Normal alignment. Normal pulse.     Left knee: Normal.     Comments: Tenderness to palpation to  right posterior leg that extends down to posterior right knee. Neurovascular intact. Has full ROM.   Neurological:     General: No focal deficit present.     Mental Status: She is alert and oriented to person, place, and time. Mental status is at baseline.  Psychiatric:        Mood and Affect: Mood normal.        Behavior: Behavior normal.        Thought Content: Thought content normal.        Judgment: Judgment normal.     UC Treatments / Results  Labs (all labs ordered are listed, but only abnormal results are displayed) Labs Reviewed - No data to display  EKG   Radiology No results found.  Procedures Procedures (including critical care time)  Medications Ordered in UC Medications  ketorolac (TORADOL) injection 30 mg (30 mg Intramuscular Given 06/08/21 1008)    Initial Impression / Assessment and Plan / UC Course  I have reviewed the triage vital signs and the nursing notes.  Pertinent labs & imaging results that were available during my care of the patient were reviewed by me and considered in my medical decision making (see chart for details).     Suspect right hamstring strain. Ketorolac injection administered in urgent care today. Prescribed ibuprofen to take 24 hours following injection if pain persists. Advised patient to use ice application and to elevate extremity. No signs of muscle tear at this time. No imaging necessary at this time. Patient to follow up if pain persists. Discussed strict return precautions. Patient verbalized understanding and is agreeable with plan.  Final Clinical Impressions(s) / UC Diagnoses   Final diagnoses:  Strain of right hamstring muscle, initial encounter  Right leg pain     Discharge Instructions      You have a strain of your hamstring muscle. You have been given ketorolac injection today in urgent care to help with pain and inflammation. You have also been prescribed ibuprofen. Please do not start this ibuprofen for at  least 24 hours following injection. Please also do not  take any additional ibuprofen, naproxen, advil, aleve while taking this ibuprofen. Use ice application to affected area of pain and elevate extremity.      ED Prescriptions     Medication Sig Dispense Auth. Provider   ibuprofen (ADVIL) 600 MG tablet Take 1 tablet (600 mg total) by mouth every 6 (six) hours as needed for mild pain or moderate pain. 30 tablet Lance Muss, FNP      PDMP not reviewed this encounter.   Lance Muss, FNP 06/08/21 1016

## 2021-06-08 NOTE — Discharge Instructions (Addendum)
You have a strain of your hamstring muscle. You have been given ketorolac injection today in urgent care to help with pain and inflammation. You have also been prescribed ibuprofen. Please do not start this ibuprofen for at least 24 hours following injection. Please also do not take any additional ibuprofen, naproxen, advil, aleve while taking this ibuprofen. Use ice application to affected area of pain and elevate extremity.

## 2021-10-04 ENCOUNTER — Ambulatory Visit
Admission: EM | Admit: 2021-10-04 | Discharge: 2021-10-04 | Disposition: A | Discharge status: Home / Self Care | Payer: Medicaid Other | Attending: Physician Assistant | Admitting: Physician Assistant

## 2021-10-04 ENCOUNTER — Other Ambulatory Visit: Payer: Self-pay

## 2021-10-04 DIAGNOSIS — N3000 Acute cystitis without hematuria: Secondary | ICD-10-CM | POA: Insufficient documentation

## 2021-10-04 LAB — POCT URINALYSIS DIP (MANUAL ENTRY)
Bilirubin, UA: NEGATIVE
Blood, UA: NEGATIVE
Glucose, UA: NEGATIVE mg/dL
Ketones, POC UA: NEGATIVE mg/dL
Nitrite, UA: NEGATIVE
Protein Ur, POC: NEGATIVE mg/dL
Spec Grav, UA: 1.025 (ref 1.010–1.025)
Urobilinogen, UA: 0.2 E.U./dL
pH, UA: 7 (ref 5.0–8.0)

## 2021-10-04 LAB — POCT URINE PREGNANCY: Preg Test, Ur: NEGATIVE

## 2021-10-04 MED ORDER — NITROFURANTOIN MONOHYD MACRO 100 MG PO CAPS: 100.0000 mg | ORAL_CAPSULE | Freq: Two times a day (BID) | ORAL | 0 refills | Status: DC

## 2021-10-04 NOTE — ED Triage Notes (Signed)
Patient presents to Urgent Care with complaints of abdominal and back pain x 2 days. Some pressure when voiding.   Denies hematuria or fever.

## 2021-10-04 NOTE — ED Provider Notes (Signed)
[facility]    CSN: [id number] Arrival date & time: 10/04/21  0940      History   Chief Complaint Chief Complaint  Patient presents with   Abdominal Pain   Back Pain    HPI Datra Lynch is a 30 y.o. female.   Patient here today for evaluation of lower abdominal pressure with voiding and some low back pain that started a few days ago.  She denies any dysuria.  She has not had any hematuria or fever.  She has had a few episodes of vomiting.  She does not report any treatment for symptoms. Patient declines vaginal discharge or any concerns for STD.   Abdominal Pain Associated symptoms: vaginal bleeding and vaginal discharge   Associated symptoms: no chest pain, no chills, no dysuria, no fever, no nausea, no shortness of breath and no vomiting   Back Pain Associated symptoms: abdominal pain   Associated symptoms: no chest pain, no dysuria and no fever    Past Medical History:  Diagnosis Date   Gestational hypertension    Pregnancy induced hypertension    G1 - Pre-E    Patient Active Problem List   Diagnosis Date Noted   Sterilization 08/02/2013   Late prenatal care 04/07/2013   Chlamydia infection, current pregnancy x 2 episodes 04/07/2013    Past Surgical History:  Procedure Laterality Date   NO PAST SURGERIES     TUBAL LIGATION Bilateral 08/02/2013   Procedure: POST PARTUM TUBAL LIGATION;  Surgeon: Willodean Rosenthal, MD;  Location: [facility];  Service: Gynecology;  Laterality: Bilateral;    OB History     Gravida  3   Para  2   Term  2   Preterm  0   AB  1   Living  2      SAB  1   IAB  0   Ectopic  0   Multiple  0   Live Births  2            Home Medications    Prior to Admission medications   Medication Sig Start Date End Date Taking? Authorizing Provider  nitrofurantoin, macrocrystal-monohydrate, (MACROBID) 100 MG capsule Take 1 capsule (100 mg total) by mouth 2 (two) times daily. 10/04/21  Yes Tomi Bamberger,  PA-C  ibuprofen (ADVIL) 600 MG tablet Take 1 tablet (600 mg total) by mouth every 6 (six) hours as needed for mild pain or moderate pain. 06/08/21   Gustavus Bryant, FNP  naproxen (NAPROSYN) 500 MG tablet Take 1 tablet (500 mg total) by mouth 2 (two) times daily. 03/19/21   Wieters, Hallie C, PA-C  pantoprazole (PROTONIX) 20 MG tablet Take 1 tablet (20 mg total) by mouth daily. 07/21/20 03/19/21  Jene Every, MD  sucralfate (CARAFATE) 1 g tablet Take 1 tablet (1 g total) by mouth 4 (four) times daily for 15 days. 07/21/20 03/19/21  Jene Every, MD    Family History Family History  Problem Relation Age of Onset   Hypertension Mother     Social History Social History   Tobacco Use   Smoking status: Former    Types: Cigarettes    Quit date: 10/21/2011    Years since quitting: 9.9   Smokeless tobacco: Never  Vaping Use   Vaping Use: Never used  Substance Use Topics   Alcohol use: No   Drug use: No     Allergies   Patient has no known allergies.   Review of Systems Review of Systems  Constitutional:  Negative for chills and fever.  Eyes:  Negative for discharge and redness.  Respiratory:  Negative for shortness of breath.   Cardiovascular:  Negative for chest pain.  Gastrointestinal:  Positive for abdominal pain. Negative for nausea and vomiting.  Genitourinary:  Positive for vaginal bleeding and vaginal discharge. Negative for dysuria, flank pain and frequency.  Musculoskeletal:  Positive for back pain.    Physical Exam Triage Vital Signs ED Triage Vitals  Enc Vitals Group     BP 10/04/21 1137 (!) 150/90     Pulse Rate 10/04/21 1137 91     Resp 10/04/21 1137 18     Temp 10/04/21 1137 98.5 F (36.9 C)     Temp Source 10/04/21 1137 Oral     SpO2 10/04/21 1137 97 %     Weight --      Height --      Head Circumference --      Peak Flow --      Pain Score 10/04/21 1116 6     Pain Loc --      Pain Edu? --      Excl. in [place]? --    No Lynn found.  Updated Vital  Signs BP (!) 150/90 (BP Location: Left Arm)    Pulse 91    Temp 98.5 F (36.9 C) (Oral)    Resp 18    LMP 09/21/2021    SpO2 97%      Physical Exam Vitals and nursing note reviewed.  Constitutional:      General: She is not in acute distress.    Appearance: Normal appearance. She is not ill-appearing.  HENT:     Head: Normocephalic and atraumatic.  Eyes:     Conjunctiva/sclera: Conjunctivae normal.  Cardiovascular:     Rate and Rhythm: Normal rate.  Pulmonary:     Effort: Pulmonary effort is normal.  Neurological:     Mental Status: She is alert.  Psychiatric:        Mood and Affect: Mood normal.        Behavior: Behavior normal.        Thought Content: Thought content normal.     UC Treatments / Results  Labs (all labs ordered are listed, but only abnormal results are displayed) Labs Reviewed  POCT URINALYSIS DIP (MANUAL ENTRY) - Abnormal; Notable for the following components:      Result Value   Clarity, UA cloudy (*)    Leukocytes, UA Moderate (2+) (*)    All other components within normal limits  URINE CULTURE  POCT URINE PREGNANCY    EKG   Radiology No results found.  Procedures Procedures (including critical care time)  Medications Ordered in UC Medications - No Lynn to display  Initial Impression / Assessment and Plan / UC Course  I have reviewed the triage vital signs and the nursing notes.  Pertinent labs & imaging results that were available during my care of the patient were reviewed by me and considered in my medical decision making (see chart for details).    Macrobid prescribed for suspected UTI.  Will order urine culture.  Recommend follow-up with any further concerns or symptoms worsen anyway.  Final Clinical Impressions(s) / UC Diagnoses   Final diagnoses:  Acute cystitis without hematuria   Discharge Instructions   None    ED Prescriptions     Medication Sig Dispense Auth. Provider   nitrofurantoin, macrocrystal-monohydrate,  (MACROBID) 100 MG capsule Take 1 capsule (100 mg total)  by mouth 2 (two) times daily. 10 capsule Francene Finders, PA-C      PDMP not reviewed this encounter.   Francene Finders, PA-C 10/04/21 1156

## 2021-10-05 LAB — URINE CULTURE

## 2022-02-13 ENCOUNTER — Ambulatory Visit (INDEPENDENT_AMBULATORY_CARE_PROVIDER_SITE_OTHER): Payer: Medicaid Other | Admitting: Adult Health

## 2022-02-13 ENCOUNTER — Encounter: Payer: Self-pay | Admitting: Adult Health

## 2022-02-13 VITALS — BP 114/80 | HR 93 | Temp 98.4°F | Ht 61.0 in | Wt 275.6 lb

## 2022-02-13 DIAGNOSIS — R0683 Snoring: Secondary | ICD-10-CM | POA: Diagnosis not present

## 2022-02-13 DIAGNOSIS — Z6841 Body Mass Index (BMI) 40.0 and over, adult: Secondary | ICD-10-CM

## 2022-02-13 NOTE — Addendum Note (Signed)
Addended by: Julio Sicks on: 02/13/2022 05:28 PM ? ? Modules accepted: Level of Service ? ?

## 2022-02-13 NOTE — Progress Notes (Signed)
? ?@Patient  ID: Lynn Lynch, female    DOB: Nov 07, 1990, 31 y.o.   MRN: [id number] ? ?Chief Complaint  ?Patient presents with  ? Consult  ? ? ?Referring provider: ?Trey Sailors, PA ? ?HPI: ?31 year old female seen for sleep consult Feb 13, 2022 for daytime sleepiness, snoring, restless sleep ? ?TEST/EVENTS :  ? ?02/13/2022 Sleep Consult  ?Patient presents for a sleep consult.  Kindly referred by PCP Raelyn Number PA/ Patient complains of snoring, daytime sleepiness, restless sleep for last 2 years.  ?Typically goes to bed about 10 PM.  Takes about an hour to go to sleep.  Is up 2-3 times a night.  Gets up about 6 AM.  Weight is up about 5 pounds over the last 2 years.  Current weight is at 275 with a BMI of 52.  Patient has never had a sleep study before.  Does not use any sleep aids to go to sleep.  Minimal caffeine. No symptoms suspicious for cataplexy or sleep paralysis.Marland Kitchen  ?Epworth score is 18 out of 24.  Typically gets sleepy with inactivity.  ? ?Medical history essentially unremarkable.  She did have gestational hypertension. ? ?Surgical history tubal ligation 2014. ? ?Social history.  Patient is single.  Does have 2  children.  Patient does not smoke denies alcohol or drug use.  Works in [employer] .  ? ?Family history : HTN .  ? ? ? ? ?No Known Allergies ? ?Immunization History  ?Administered Date(s) Administered  ? Influenza Split 07/14/2013  ? Tdap 06/30/2013  ? ? ?Past Medical History:  ?Diagnosis Date  ? Gestational hypertension   ? Pregnancy induced hypertension   ? G1 - Pre-E  ? ? ?Tobacco History: ?Social History  ? ?Tobacco Use  ?Smoking Status Former  ? Types: Cigarettes  ? Quit date: 10/21/2011  ? Years since quitting: 10.3  ?Smokeless Tobacco Never  ? ?Counseling given: Not Answered ? ? ?Outpatient Medications Prior to Visit  ?Medication Sig Dispense Refill  ? ibuprofen (ADVIL) 600 MG tablet Take 1 tablet (600 mg total) by mouth every 6 (six) hours as needed for mild pain or moderate pain.  30 tablet 0  ? naproxen (NAPROSYN) 500 MG tablet Take 1 tablet (500 mg total) by mouth 2 (two) times daily. 30 tablet 0  ? nitrofurantoin, macrocrystal-monohydrate, (MACROBID) 100 MG capsule Take 1 capsule (100 mg total) by mouth 2 (two) times daily. 10 capsule 0  ? ?No facility-administered medications prior to visit.  ? ? ? ?Review of Systems:  ? ?Constitutional:   No  weight loss, night sweats,  Fevers, chills, fatigue, or  lassitude. ? ?HEENT:   No headaches,  Difficulty swallowing,  Tooth/dental problems, or  Sore throat,  ?              No sneezing, itching, ear ache, nasal congestion, post nasal drip,  ? ?CV:  No chest pain,  Orthopnea, PND, swelling in lower extremities, anasarca, dizziness, palpitations, syncope.  ? ?GI  No heartburn, indigestion, abdominal pain, nausea, vomiting, diarrhea, change in bowel habits, loss of appetite, bloody stools.  ? ?Resp: No shortness of breath with exertion or at rest.  No excess mucus, no productive cough,  No non-productive cough,  No coughing up of blood.  No change in color of mucus.  No wheezing.  No chest wall deformity ? ?Skin: no rash or lesions. ? ?GU: no dysuria, change in color of urine, no urgency or frequency.  No flank pain, no hematuria  ? ?  MS:  No joint pain or swelling.  No decreased range of motion.  No back pain. ? ? ? ?Physical Exam ? ?BP 114/80 (BP Location: Left Wrist, Patient Position: Sitting, Cuff Size: Normal)   Pulse 93   Temp 98.4 ?F (36.9 ?C) (Oral)   Ht 5\' 1"  (1.549 m)   Wt 275 lb 9.6 oz (125 kg)   SpO2 99%   BMI 52.07 kg/m?  ? ?GEN: A/Ox3; pleasant , NAD, well nourished  ?  ?HEENT:  /AT,  NOSE-clear, THROAT-clear, no lesions, no postnasal drip or exudate noted. Class 3 MP airway  ? ?NECK:  Supple w/ fair ROM; no JVD; normal carotid impulses w/o bruits; no thyromegaly or nodules palpated; no lymphadenopathy.   ? ?RESP  Clear  P & A; w/o, wheezes/ rales/ or rhonchi. no accessory muscle use, no dullness to percussion ? ?CARD:  RRR, no  m/r/g, no peripheral edema, pulses intact, no cyanosis or clubbing. ? ?GI:   Soft & nt; nml bowel sounds; no organomegaly or masses detected.  ? ?Musco: Warm bil, no deformities or joint swelling noted.  ? ?Neuro: alert, no focal deficits noted.   ? ?Skin: Warm, no lesions or rashes ? ? ? ?Lab Results: ? ? ? ? ?BNP ?No results found for: BNP ? ?ProBNP ?No results found for: PROBNP ? ?Imaging: ?No results found. ? ? ? ?   ? View : No data to display.  ?  ?  ?  ? ? ?No results found for: NITRICOXIDE ? ? ? ? ? ?Assessment & Plan:  ? ?Snoring ?Snoring, restless sleep, daytime sleepiness, BMI 52 all suspicious for underlying sleep apnea.  Patient will be set up for home sleep study. ? ?- discussed how weight can impact sleep and risk for sleep disordered breathing ?- discussed options to assist with weight loss: combination of diet modification, cardiovascular and strength training exercises ?  ?- had an extensive discussion regarding the adverse health consequences related to untreated sleep disordered breathing ?- specifically discussed the risks for hypertension, coronary artery disease, cardiac dysrhythmias, cerebrovascular disease, and diabetes ?- lifestyle modification discussed ?  ?- discussed how sleep disruption can increase risk of accidents, particularly when driving ?- safe driving practices were discussed ?  ?Plan  ?Patient Instructions  ?Set up for home sleep study .  ?Work on healthy weight loss.  ?Do not drive if sleepy  ?Follow up in 6 weeks to discuss results and treatment plan . Can be in person or video.  ? ?  ? ? ?Morbid obesity with BMI of 50.0-59.9, adult ([place]) ?Healthy weight loss discussed ? ? ? ? ?Rexene Edison, NP ?02/13/2022 ? ?

## 2022-02-13 NOTE — Assessment & Plan Note (Signed)
Healthy weight loss discussed 

## 2022-02-13 NOTE — Patient Instructions (Addendum)
Set up for home sleep study .  ?Work on healthy weight loss.  ?Do not drive if sleepy  ?Follow up in 6 weeks to discuss results and treatment plan . Can be in person or video.  ? ?

## 2022-02-13 NOTE — Assessment & Plan Note (Signed)
Snoring, restless sleep, daytime sleepiness, BMI 52 all suspicious for underlying sleep apnea.  Patient will be set up for home sleep study. ? ?- discussed how weight can impact sleep and risk for sleep disordered breathing ?- discussed options to assist with weight loss: combination of diet modification, cardiovascular and strength training exercises ?  ?- had an extensive discussion regarding the adverse health consequences related to untreated sleep disordered breathing ?- specifically discussed the risks for hypertension, coronary artery disease, cardiac dysrhythmias, cerebrovascular disease, and diabetes ?- lifestyle modification discussed ?  ?- discussed how sleep disruption can increase risk of accidents, particularly when driving ?- safe driving practices were discussed ?  ?Plan  ?Patient Instructions  ?Set up for home sleep study .  ?Work on healthy weight loss.  ?Do not drive if sleepy  ?Follow up in 6 weeks to discuss results and treatment plan . Can be in person or video.  ? ?  ? ?

## 2022-02-24 ENCOUNTER — Ambulatory Visit: Payer: Medicaid Other | Admitting: Podiatrist

## 2022-03-30 ENCOUNTER — Encounter (HOSPITAL_COMMUNITY): Payer: Self-pay

## 2022-03-30 ENCOUNTER — Other Ambulatory Visit: Payer: Self-pay

## 2022-03-30 ENCOUNTER — Emergency Department (HOSPITAL_COMMUNITY)
Admission: EM | Admit: 2022-03-30 | Discharge: 2022-03-30 | Disposition: A | Discharge status: Home / Self Care | Payer: Medicaid Other | Attending: Emergency Medicine | Admitting: Emergency Medicine

## 2022-03-30 DIAGNOSIS — R748 Abnormal levels of other serum enzymes: Secondary | ICD-10-CM | POA: Diagnosis not present

## 2022-03-30 DIAGNOSIS — R7309 Other abnormal glucose: Secondary | ICD-10-CM | POA: Diagnosis not present

## 2022-03-30 DIAGNOSIS — D649 Anemia, unspecified: Secondary | ICD-10-CM | POA: Insufficient documentation

## 2022-03-30 DIAGNOSIS — R1084 Generalized abdominal pain: Secondary | ICD-10-CM | POA: Insufficient documentation

## 2022-03-30 DIAGNOSIS — R112 Nausea with vomiting, unspecified: Secondary | ICD-10-CM

## 2022-03-30 DIAGNOSIS — R1110 Vomiting, unspecified: Secondary | ICD-10-CM | POA: Insufficient documentation

## 2022-03-30 LAB — CBC
HCT: 32.7 % — ABNORMAL LOW (ref 36.0–46.0)
Hemoglobin: 10.7 g/dL — ABNORMAL LOW (ref 12.0–15.0)
MCH: 30.8 pg (ref 26.0–34.0)
MCHC: 32.7 g/dL (ref 30.0–36.0)
MCV: 94.2 fL (ref 80.0–100.0)
Platelets: 262 10*3/uL (ref 150–400)
RBC: 3.47 MIL/uL — ABNORMAL LOW (ref 3.87–5.11)
RDW: 13.9 % (ref 11.5–15.5)
WBC: 5.1 10*3/uL (ref 4.0–10.5)
nRBC: 0 % (ref 0.0–0.2)

## 2022-03-30 LAB — URINALYSIS, ROUTINE W REFLEX MICROSCOPIC
Bacteria, UA: NONE SEEN
Bilirubin Urine: NEGATIVE
Glucose, UA: NEGATIVE mg/dL
Ketones, ur: NEGATIVE mg/dL
Nitrite: NEGATIVE
Protein, ur: NEGATIVE mg/dL
Specific Gravity, Urine: 1.011 (ref 1.005–1.030)
pH: 6 (ref 5.0–8.0)

## 2022-03-30 LAB — COMPREHENSIVE METABOLIC PANEL
ALT: 15 U/L (ref 0–44)
AST: 41 U/L (ref 15–41)
Albumin: 3.7 g/dL (ref 3.5–5.0)
Alkaline Phosphatase: 36 U/L — ABNORMAL LOW (ref 38–126)
Anion gap: 6 (ref 5–15)
BUN: 15 mg/dL (ref 6–20)
CO2: 26 mmol/L (ref 22–32)
Calcium: 9.2 mg/dL (ref 8.9–10.3)
Chloride: 103 mmol/L (ref 98–111)
Creatinine, Ser: 0.88 mg/dL (ref 0.44–1.00)
GFR, Estimated: >60 mL/min (ref >60)
Glucose, Bld: 103 mg/dL — ABNORMAL HIGH (ref 70–99)
Potassium: 5.1 mmol/L (ref 3.5–5.1)
Sodium: 135 mmol/L (ref 135–145)
Total Bilirubin: 1.4 mg/dL — ABNORMAL HIGH (ref 0.3–1.2)
Total Protein: 7.4 g/dL (ref 6.5–8.1)

## 2022-03-30 LAB — LIPASE, BLOOD: Lipase: 23 U/L (ref 11–51)

## 2022-03-30 LAB — I-STAT BETA HCG BLOOD, ED (MC, WL, AP ONLY): I-stat hCG, quantitative: <5 m[IU]/mL (ref <5)

## 2022-03-30 MED ORDER — ONDANSETRON HCL 4 MG PO TABS: 4.0000 mg | ORAL_TABLET | Freq: Three times a day (TID) | ORAL | 0 refills | Status: AC | PRN

## 2022-03-30 MED ORDER — FENTANYL CITRATE PF 50 MCG/ML IJ SOSY
50.0000 ug | PREFILLED_SYRINGE | Freq: Once | INTRAMUSCULAR | Status: DC
  Filled 2022-03-30: qty 1

## 2022-03-30 MED ORDER — ONDANSETRON HCL 4 MG/2ML IJ SOLN
4.0000 mg | Freq: Once | INTRAMUSCULAR | Status: AC
  Administered 2022-03-30: 4 mg via INTRAVENOUS
  Filled 2022-03-30: qty 2

## 2022-03-30 MED ORDER — DICYCLOMINE HCL 20 MG PO TABS: 20.0000 mg | ORAL_TABLET | Freq: Two times a day (BID) | ORAL | 0 refills | Status: AC

## 2022-03-30 NOTE — ED Notes (Signed)
Patient verbalizes understanding of discharge instructions. Opportunity for questioning and answers were provided. Armband removed by staff, pt discharged from ED to home via POV. Alert and oriented x 4 and ambulatory without assistive device at DC.

## 2022-03-30 NOTE — Discharge Instructions (Signed)

## 2022-03-30 NOTE — ED Triage Notes (Signed)
Patient said she has vomited all day, cannot keep anything down. Slight pain on the right side of her stomach.

## 2022-03-30 NOTE — ED Provider Notes (Signed)
[facility] DEPT Provider Note   CSN: [id number] Arrival date & time: 03/30/22  0148     History  Chief Complaint  Patient presents with   Emesis    Lynn Lynch is a 31 y.o. female.  The history is provided by the patient.  Emesis Severity:  Moderate Duration:  2 days Timing:  Intermittent Quality:  Undigested food Progression:  Unchanged Chronicity:  New Recent urination:  Normal Relieved by:  None tried Worsened by:  Nothing Ineffective treatments: tylenol. Associated symptoms: abdominal pain   Associated symptoms: no arthralgias, no chills, no cough, no diarrhea, no fever, no headaches, no myalgias and no sore throat   Abdominal pain:    Location:  Generalized   Quality: cramping     Severity:  Moderate   Timing:  Intermittent Risk factors: no alcohol use, no diabetes, not pregnant, no prior abdominal surgery, no sick contacts, no suspect food intake and no travel to endemic areas        Home Medications Prior to Admission medications   Medication Sig Start Date End Date Taking? Authorizing Provider  pantoprazole (PROTONIX) 20 MG tablet Take 1 tablet (20 mg total) by mouth daily. 07/21/20 03/19/21  Lavonia Drafts, MD  sucralfate (CARAFATE) 1 g tablet Take 1 tablet (1 g total) by mouth 4 (four) times daily for 15 days. 07/21/20 03/19/21  Lavonia Drafts, MD      Allergies    Patient has no known allergies.    Review of Systems   Review of Systems  Constitutional:  Negative for chills and fever.  HENT:  Negative for sore throat.   Respiratory:  Negative for cough.   Gastrointestinal:  Positive for abdominal pain and vomiting. Negative for diarrhea.  Musculoskeletal:  Negative for arthralgias and myalgias.  Neurological:  Negative for headaches.    Physical Exam Updated Vital Signs BP 129/80 (BP Location: Left Arm)   Pulse 60   Temp 98.3 F (36.8 C) (Oral)   Resp 16   Ht _0  (1.549 m)   Wt 125.2 kg   SpO2 100%   BMI  52.15 kg/m  Physical Exam Vitals and nursing note reviewed.  Constitutional:      General: She is not in acute distress.    Appearance: She is well-developed. She is not diaphoretic.  HENT:     Head: Normocephalic and atraumatic.     Right Ear: External ear normal.     Left Ear: External ear normal.     Nose: Nose normal.     Mouth/Throat:     Mouth: Mucous membranes are moist.  Eyes:     General: No scleral icterus.    Conjunctiva/sclera: Conjunctivae normal.  Cardiovascular:     Rate and Rhythm: Normal rate and regular rhythm.     Heart sounds: Normal heart sounds. No murmur heard.    No friction rub. No gallop.  Pulmonary:     Effort: Pulmonary effort is normal. No respiratory distress.     Breath sounds: Normal breath sounds.  Abdominal:     General: Bowel sounds are normal. There is no distension.     Palpations: Abdomen is soft. There is no mass.     Tenderness: There is no abdominal tenderness. There is no guarding.  Musculoskeletal:     Cervical back: Normal range of motion.  Skin:    General: Skin is warm and dry.  Neurological:     Mental Status: She is alert and oriented to person, place,  and time.  Psychiatric:        Behavior: Behavior normal.     ED Results / Procedures / Treatments   Labs (all labs ordered are listed, but only abnormal results are displayed) Labs Reviewed  CBC - Abnormal; Notable for the following components:      Result Value   RBC 3.47 (*)    Hemoglobin 10.7 (*)    HCT 32.7 (*)    All other components within normal limits  URINALYSIS, ROUTINE W REFLEX MICROSCOPIC - Abnormal; Notable for the following components:   Color, Urine STRAW (*)    Hgb urine dipstick MODERATE (*)    Leukocytes,Ua SMALL (*)    All other components within normal limits  LIPASE, BLOOD  COMPREHENSIVE METABOLIC PANEL  I-STAT BETA HCG BLOOD, ED ([facility], [facility], [facility] ONLY)    EKG None  Radiology No results found.  Procedures Procedures    Medications  Ordered in ED Medications  fentaNYL (SUBLIMAZE) injection 50 mcg (has no administration in time range)  ondansetron (ZOFRAN) injection 4 mg (has no administration in time range)    ED Course/ Medical Decision Making/ A&P                           Medical Decision Making Patient here with vomiting. The emergent differential diagnosis for vomiting includes, but is not limited to ACS/MI, DKA, Ischemic bowel, Meningitis, Sepsis, Acute gastric dilation, Adrenal insufficiency, Appendicitis,  Bowel obstruction/ileus, Carbon monoxide poisoning, Cholecystitis, Electrolyte abnormalities, Elevated ICP, Gastric outlet obstruction, Pancreatitis, Ruptured viscus, Biliary colic, Cannabinoid hyperemesis syndrome, Gastritis, Gastroenteritis, Gastroparesis,  Narcotic withdrawal, Peptic ulcer disease, and UTI After review of all data points and patient likely has gastritis or viral infection.  She was treated with Zofran and has no nausea is tolerating fluids and denies any current abdominal pain.  Will discharge with Bentyl and Zofran.  Considered CT imaging but given benign examination do not think this is necessary at this time.  Amount and/or Complexity of Data Reviewed Labs: ordered.    Details: I reviewed the patient's labs including negative pregnancy test, lipase within normal limits, mildly elevated blood glucose, slightly elevated bili and alk phos however no tenderness in the right upper quadrant.  CBC shows mild anemia.  Urine within normal limits  Risk Prescription drug management.           Final Clinical Impression(s) / ED Diagnoses Final diagnoses:  None    Rx / DC Orders ED Discharge Orders     None         Margarita Mail, PA-C 03/30/22 0517    Margette Fast, MD 03/30/22 2349

## 2022-04-28 ENCOUNTER — Ambulatory Visit: Payer: Medicaid Other

## 2022-04-28 DIAGNOSIS — G4710 Hypersomnia, unspecified: Secondary | ICD-10-CM

## 2022-04-28 DIAGNOSIS — R0683 Snoring: Secondary | ICD-10-CM

## 2022-05-01 ENCOUNTER — Telehealth: Payer: Self-pay | Admitting: Pulmonary Disease

## 2022-05-01 DIAGNOSIS — G4710 Hypersomnia, unspecified: Secondary | ICD-10-CM | POA: Diagnosis not present

## 2022-05-01 NOTE — Telephone Encounter (Signed)
I called the patient and gave her the results and she voices understanding. She will call for a follow up at a later time if her symptoms persist. Nothing further needed.

## 2022-05-01 NOTE — Telephone Encounter (Signed)
Call patient  Sleep study result  Date of study: 04/28/2022  Impression: Negative for significant sleep disordered breathing Negative for oxygen desaturations  Recommendation: Follow-up as previously scheduled  Consider in lab polysomnogram if there is significant prestudy clinical concern for sleep apnea as up to 20 to 25% of home sleep study may be falsely negative.  A negative home sleep study however, will suggest absence of moderate to severe sleep disordered breathing  Encourage weight loss measures.
